# Patient Record
Sex: Female | Born: 1975 | Race: Asian | Hispanic: No | Marital: Married | Smoking: Never smoker
Health system: Southern US, Community
[De-identification: ages and names within clinical notes are randomized; demographics above are authoritative.]

## PROBLEM LIST (undated history)

## (undated) DIAGNOSIS — M1990 Unspecified osteoarthritis, unspecified site: Secondary | ICD-10-CM

## (undated) DIAGNOSIS — G43909 Migraine, unspecified, not intractable, without status migrainosus: Secondary | ICD-10-CM

## (undated) DIAGNOSIS — C50919 Malignant neoplasm of unspecified site of unspecified female breast: Secondary | ICD-10-CM

## (undated) HISTORY — PX: BILATERAL TOTAL MASTECTOMY WITH AXILLARY LYMPH NODE DISSECTION: SHX6364

## (undated) HISTORY — DX: Unspecified osteoarthritis, unspecified site: M19.90

## (undated) HISTORY — DX: Malignant neoplasm of unspecified site of unspecified female breast: C50.919

## (undated) HISTORY — DX: Migraine, unspecified, not intractable, without status migrainosus: G43.909

---

## 2018-03-24 DIAGNOSIS — Z170 Estrogen receptor positive status [ER+]: Principal | ICD-10-CM

## 2018-03-24 DIAGNOSIS — Z9013 Acquired absence of bilateral breasts and nipples: Secondary | ICD-10-CM | POA: Insufficient documentation

## 2018-03-24 DIAGNOSIS — C50811 Malignant neoplasm of overlapping sites of right female breast: Secondary | ICD-10-CM | POA: Insufficient documentation

## 2018-04-13 ENCOUNTER — Telehealth: Payer: Self-pay

## 2018-04-13 ENCOUNTER — Ambulatory Visit: Payer: Federal, State, Local not specified - PPO | Admitting: Adult Health

## 2018-04-13 ENCOUNTER — Encounter: Payer: Self-pay | Admitting: Adult Health

## 2018-04-13 VITALS — BP 108/72 | HR 84 | Resp 16 | Ht 63.0 in | Wt 135.0 lb

## 2018-04-13 DIAGNOSIS — Z170 Estrogen receptor positive status [ER+]: Secondary | ICD-10-CM

## 2018-04-13 DIAGNOSIS — G4700 Insomnia, unspecified: Secondary | ICD-10-CM | POA: Diagnosis not present

## 2018-04-13 DIAGNOSIS — N3000 Acute cystitis without hematuria: Secondary | ICD-10-CM

## 2018-04-13 DIAGNOSIS — G43809 Other migraine, not intractable, without status migrainosus: Secondary | ICD-10-CM | POA: Diagnosis not present

## 2018-04-13 DIAGNOSIS — C50811 Malignant neoplasm of overlapping sites of right female breast: Secondary | ICD-10-CM

## 2018-04-13 MED ORDER — AMITRIPTYLINE HCL 10 MG PO TABS: 10.0000 mg | ORAL_TABLET | Freq: Every day | ORAL | 3 refills | Status: DC

## 2018-04-13 NOTE — Telephone Encounter (Signed)
Faxed medical record request to [place]

## 2018-04-13 NOTE — Patient Instructions (Signed)

## 2018-04-13 NOTE — Progress Notes (Signed)
[facility] [place], [place] [address]  Internal MEDICINE  Office Visit Note  Patient Name: Deanna Dillon  [id number]  [id number]  Date of Service: 04/29/2018   Complaints/HPI Pt is here for establishment of PCP. Chief Complaint  Patient presents with  . Cancer    new pt hx breast cancer,   . Anxiety  . Quality Metric Gaps    pap smear   HPI Pt here to establish care.  She has a history of Breast CA with double mastectomy in September 2018.  She had radiation for 4 weeks and now is managed with PO medication.  She states she will take this medications for 10 years. She also reports history of Migraines, and insomnia.  She reports most of these issues have come about after starting Tamoxifen. Her other problem is having frequent UTI . 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 1/7 LNs were involved, with 2.0769mumor. Bios were ER/PR positive, her2 negative.  - Oncotype RS was 18 - did not get any adjuvant chemotherapy - completed adjuvant RT 06/2017 - started tamoxifen 07/2017  Current Medication: Outpatient Encounter Medications as of 04/13/2018  Medication Sig  . amitriptyline (ELAVIL) 10 MG tablet Take 1 tablet (10 mg total) by mouth at bedtime.  . cyclobenzaprine (FLEXERIL) 10 MG tablet Take 1 tablet (10 mg total) by mouth daily.  . tamoxifen (NOLVADEX) 20 MG tablet Take 20 mg by mouth daily.  . [DISCONTINUED] amitriptyline (ELAVIL) 10 MG tablet TAKE 1 TABLET BY MOUTH EVERY DAY IN THE EVENING AS NEEDED   No facility-administered encounter medications on file as of 04/13/2018.     Surgical History: Past Surgical History:  Procedure Laterality Date  . BILATERAL TOTAL MASTECTOMY WITH AXILLARY LYMPH NODE DISSECTION      Medical History: Past Medical History:  Diagnosis Date  .  [facility] ([facility]   Family History: Family History  Problem Relation Age of Onset  . Diabetes Mother   . Hypertension Mother   . Hyperlipidemia Mother     Social History   Socioeconomic History  . Marital status: Married    Spouse name: Not on file  . Number of children: Not on file  . Years of education: Not on file  . Highest education level: Not on file  Occupational History  . Not on file  Social Needs  . Financial resource strain: Not on file  . Food insecurity:    Worry: Not on file    Inability: Not on file  . Transportation needs:    Medical: Not on file    Non-medical: Not on file  Tobacco Use  . Smoking status: Never Smoker  . Smokeless tobacco: Never Used  Substance and Sexual Activity  . Alcohol use: Never    Frequency: Never  . Drug use: Never  . Sexual activity: Not on file  Lifestyle  . Physical activity:    Days per week: Not on file    Minutes per session: Not on file  . Stress: Not on file  Relationships  . Social connections:    Talks on phone: Not on file    Gets together: Not on file    Attends religious service: Not on file    Active member of club or organization: Not on file  Attends meetings of clubs or organizations: Not on file    Relationship status: Not on file  . Intimate partner violence:    Fear of current or ex partner: Not on file    Emotionally abused: Not on file    Physically abused: Not on file    Forced sexual activity: Not on file  Other Topics Concern  . Not on file  Social History Narrative  . Not on file    Review of Systems  Constitutional: Negative for chills, fatigue and unexpected weight change.  HENT: Negative for congestion, rhinorrhea, sneezing and sore throat.   Eyes: Negative for photophobia, pain and redness.  Respiratory: Negative for cough, chest tightness and shortness of breath.   Cardiovascular: Negative for chest pain and palpitations.  Gastrointestinal: Negative for abdominal pain,  constipation, diarrhea, nausea and vomiting.  Endocrine: Negative.   Genitourinary: Negative for dysuria and frequency.  Musculoskeletal: Negative for arthralgias, back pain, joint swelling and neck pain.  Skin: Negative for rash.  Allergic/Immunologic: Negative.   Neurological: Negative for tremors and numbness.  Hematological: Negative for adenopathy. Does not bruise/bleed easily.  Psychiatric/Behavioral: Negative for behavioral problems and sleep disturbance. The patient is not nervous/anxious.     Vital Signs: BP 108/72   Pulse 84   Resp 16   Ht '5\' 3"'$  (1.6 m)   Wt 135 lb (61.2 kg)   SpO2 98%   BMI 23.91 kg/m    Physical Exam  Constitutional: She is oriented to person, place, and time. She appears well-developed and well-nourished. No distress.  HENT:  Head: Normocephalic and atraumatic.  Mouth/Throat: Oropharynx is clear and moist. No oropharyngeal exudate.  Eyes: Pupils are equal, round, and reactive to light. EOM are normal.  Neck: Normal range of motion. Neck supple. No JVD present. No tracheal deviation present. No thyromegaly present.  Cardiovascular: Normal rate, regular rhythm and normal heart sounds. Exam reveals no gallop and no friction rub.  No murmur heard. Pulmonary/Chest: Effort normal and breath sounds normal. No respiratory distress. She has no wheezes. She has no rales. She exhibits no tenderness.  Abdominal: Soft. There is no tenderness. There is no guarding.  Musculoskeletal: Normal range of motion.  Lymphadenopathy:    She has no cervical adenopathy.  Neurological: She is alert and oriented to person, place, and time. No cranial nerve deficit.  Skin: Skin is warm and dry. She is not diaphoretic.  Psychiatric: She has a normal mood and affect. Her behavior is normal. Judgment and thought content normal.  Nursing note and vitals reviewed.  Assessment/Plan: 1. Malignant neoplasm of overlapping sites of right breast in female, estrogen receptor positive  ([place]) Pt has establish care at [facility] to continue her follow ups.    2. Other migraine without status migrainosus, not intractable Pt takes elavil at night for migraines.  Has good results. Might add low dose topamax 25 mg po qhs if needed   3. Insomnia, unspecified type Pt takes Elavil at night to help with insomnia.  She has good results with this. Continues as directed.   4. Acute cystitis without hematuria Might be related to estrogen deficiency leading to atrophic vaginitis  - Ambulatory referral to Urology  General Counseling: Ramatoulaye verbalizes understanding of the findings of todays visit and agrees with plan of treatment. I have discussed any further diagnostic evaluation that may be needed or ordered today. We also reviewed her medications today. she has been encouraged to call the office with any questions or concerns that  should arise related to todays visit.  Orders Placed This Encounter  Procedures  . Ambulatory referral to Urology    Meds ordered this encounter  Medications  . amitriptyline (ELAVIL) 10 MG tablet    Sig: Take 1 tablet (10 mg total) by mouth at bedtime.    Dispense:  30 tablet    Refill:  3  . cyclobenzaprine (FLEXERIL) 10 MG tablet    Sig: Take 1 tablet (10 mg total) by mouth daily.    Dispense:  30 tablet    Refill:  0   Time spent: 25 Minutes   This patient was seen by Orson Gear AGNP-C in Collaboration with Dr Lavera Guise as a part of collaborative care agreement

## 2018-04-18 ENCOUNTER — Telehealth: Payer: Self-pay

## 2018-04-18 MED ORDER — CYCLOBENZAPRINE HCL 10 MG PO TABS: 10.0000 mg | ORAL_TABLET | Freq: Every day | ORAL | 0 refills | Status: DC

## 2018-04-18 NOTE — Telephone Encounter (Signed)
Left message on pt voicemail letting her know we sent her rx for flexaril and also adam sent a referral for her to see a urologist to [facility] and she will give her a call as soon as she get the appointment. (pt left message requesting a referral to urologist due to reoccurring  UTI's.

## 2018-04-20 ENCOUNTER — Encounter: Payer: Self-pay | Admitting: Adult Health

## 2018-04-20 ENCOUNTER — Ambulatory Visit: Payer: Federal, State, Local not specified - PPO | Admitting: Adult Health

## 2018-04-20 VITALS — BP 100/70 | HR 95 | Temp 99.1°F | Resp 16 | Ht 63.0 in | Wt 139.0 lb

## 2018-04-20 DIAGNOSIS — G43809 Other migraine, not intractable, without status migrainosus: Secondary | ICD-10-CM | POA: Diagnosis not present

## 2018-04-20 DIAGNOSIS — S161XXA Strain of muscle, fascia and tendon at neck level, initial encounter: Secondary | ICD-10-CM | POA: Diagnosis not present

## 2018-04-20 MED ORDER — IBUPROFEN 600 MG PO TABS: 600.0000 mg | ORAL_TABLET | Freq: Three times a day (TID) | ORAL | 0 refills | Status: DC | PRN

## 2018-04-20 NOTE — Progress Notes (Signed)
[facility] [place], [place] [address]  Internal MEDICINE  Office Visit Note  Patient Name: Deanna Dillon  [id number]  [id number]  Date of Service: 05/01/2018  Chief Complaint  Patient presents with  . Migraine  . Neck Pain   HPI Pt is here for a sick visit.  Pt reports about one week ago she started having neck pain and tightness.  This pain has moved down into her shoulders.  She reports extra stress and tension with work in the last few weeks.  She started taking her flexeril with some relief. She denies trauma or injury.  No fever, Nausea or vomiting.  The pain is worse with palpation.   Current Medication:  Outpatient Encounter Medications as of 04/20/2018  Medication Sig  . amitriptyline (ELAVIL) 10 MG tablet Take 1 tablet (10 mg total) by mouth at bedtime.  . cyclobenzaprine (FLEXERIL) 10 MG tablet Take 1 tablet (10 mg total) by mouth daily.  . tamoxifen (NOLVADEX) 20 MG tablet Take 20 mg by mouth daily.  Marland Kitchen ibuprofen (ADVIL,MOTRIN) 600 MG tablet Take 1 tablet (600 mg total) by mouth every 8 (eight) hours as needed.   No facility-administered encounter medications on file as of 04/20/2018.    Medical History: Past Medical History:  Diagnosis Date  . Breast cancer ([facility]CC)    Vital Signs: BP 100/70   Pulse 95   Temp 99.1 F (37.3 C)   Resp 16   Ht 5\' 3"  (1.6 m)   Wt 139 lb (63 kg)   SpO2 98%   BMI 24.62 kg/m   Review of Systems  Constitutional: Negative for chills, fatigue and unexpected weight change.  HENT: Negative for congestion, rhinorrhea, sneezing and sore throat.   Eyes: Negative for photophobia, pain and redness.  Respiratory: Negative for cough, chest tightness and shortness of breath.   Cardiovascular: Negative for chest pain and palpitations.  Gastrointestinal: Negative for abdominal pain, constipation, diarrhea, nausea and vomiting.  Endocrine: Negative.   Genitourinary: Negative for dysuria and frequency.  Musculoskeletal:  Negative for arthralgias, back pain, joint swelling and neck pain.  Skin: Negative for rash.  Allergic/Immunologic: Negative.   Neurological: Negative for tremors and numbness.  Hematological: Negative for adenopathy. Does not bruise/bleed easily.  Psychiatric/Behavioral: Negative for behavioral problems and sleep disturbance. The patient is not nervous/anxious.    Physical Exam  Constitutional: She is oriented to person, place, and time. She appears well-developed and well-nourished. No distress.  HENT:  Head: Normocephalic and atraumatic.  Mouth/Throat: Oropharynx is clear and moist. No oropharyngeal exudate.  Eyes: Pupils are equal, round, and reactive to light. EOM are normal.  Neck: Normal range of motion. Neck supple. No JVD present. No tracheal deviation present. No thyromegaly present.  Cardiovascular: Normal rate, regular rhythm and normal heart sounds. Exam reveals no gallop and no friction rub.  No murmur heard. Pulmonary/Chest: Effort normal and breath sounds normal. No respiratory distress. She has no wheezes. She has no rales. She exhibits no tenderness.  Abdominal: Soft. There is no tenderness. There is no guarding.  Musculoskeletal: Normal range of motion. She exhibits tenderness.  Neck pain, stiffness    Lymphadenopathy:    She has no cervical adenopathy.  Neurological: She is alert and oriented to person, place, and time. No cranial nerve deficit.  Skin: Skin is warm and dry. She is not diaphoretic.  Psychiatric: She has a normal mood and affect. Her behavior is normal. Judgment and thought content normal.  Nursing note and vitals  reviewed.  Assessment/Plan: 1. Strain of neck muscle, initial encounter Take Motrin as directed. Use Heating pad, 35minutes on 2 hours off.  Rest for a few days and return to clinic if symptoms do not resolve.   - ibuprofen (ADVIL,MOTRIN) 600 MG tablet; Take 1 tablet (600 mg total) by mouth every 8 (eight) hours as needed.  Dispense: 30  tablet; Refill: 0  2. Other migraine without status migrainosus, not intractable Pt has intermittent migraines.  She is doing well currently with medications she already has.  Will consider Emgality in future.   General Counseling: Deanna Dillon verbalizes understanding of the findings of todays visit and agrees with plan of treatment. I have discussed any further diagnostic evaluation that may be needed or ordered today. We also reviewed her medications today. she has been encouraged to call the office with any questions or concerns that should arise related to todays visit.   Meds ordered this encounter  Medications  . ibuprofen (ADVIL,MOTRIN) 600 MG tablet    Sig: Take 1 tablet (600 mg total) by mouth every 8 (eight) hours as needed.    Dispense:  30 tablet    Refill:  0    Time spent: 25 Minutes  This patient was seen by Orson Gear AGNP-C in Collaboration with Dr Lavera Guise as a part of collaborative care agreement

## 2018-05-09 ENCOUNTER — Other Ambulatory Visit: Payer: Self-pay | Admitting: Adult Health

## 2018-05-09 DIAGNOSIS — S161XXA Strain of muscle, fascia and tendon at neck level, initial encounter: Secondary | ICD-10-CM

## 2018-05-09 MED ORDER — CYCLOBENZAPRINE HCL 10 MG PO TABS: 10.0000 mg | ORAL_TABLET | Freq: Every day | ORAL | 2 refills | Status: DC

## 2018-05-17 ENCOUNTER — Other Ambulatory Visit: Payer: Self-pay

## 2018-05-17 DIAGNOSIS — Z170 Estrogen receptor positive status [ER+]: Principal | ICD-10-CM

## 2018-05-17 DIAGNOSIS — C50811 Malignant neoplasm of overlapping sites of right female breast: Secondary | ICD-10-CM

## 2018-05-18 ENCOUNTER — Ambulatory Visit: Payer: Federal, State, Local not specified - PPO | Admitting: Urology

## 2018-05-18 ENCOUNTER — Encounter: Payer: Self-pay | Admitting: Urology

## 2018-05-18 VITALS — BP 118/82 | HR 101 | Ht 63.0 in | Wt 139.4 lb

## 2018-05-18 DIAGNOSIS — N390 Urinary tract infection, site not specified: Secondary | ICD-10-CM | POA: Diagnosis not present

## 2018-05-18 LAB — URINALYSIS, COMPLETE
Bilirubin, UA: NEGATIVE
Glucose, UA: NEGATIVE
Ketones, UA: NEGATIVE
LEUKOCYTES UA: NEGATIVE
Nitrite, UA: NEGATIVE
PH UA: 6 (ref 5.0–7.5)
PROTEIN UA: NEGATIVE
Specific Gravity, UA: 1.015 (ref 1.005–1.030)
Urobilinogen, Ur: 0.2 mg/dL (ref 0.2–1.0)

## 2018-05-18 LAB — MICROSCOPIC EXAMINATION

## 2018-05-18 NOTE — Progress Notes (Signed)
05/18/2018 10:12 AM   Deanna Dillon 1976-06-30 [id number]  Referring provider: Kendell Bane, NP [place], [place] [address]  Chief Complaint  Patient presents with  . Recurrent UTI    HPI: 42 year old female seen in consultation at the request of Orson Gear, NP for evaluation of recurrent UTI.  She relates to a long history of recurrent lower tract UTIs.  Although her current records have no documentation she states when living in [place] she typically had UTIs 4-6 times per month.  Her symptoms typically consist of onset of urinary frequency, urgency, dysuria and a tingling sensation in her hands and feet.  There is no history of febrile UTI or pyelonephritis.  No prior urologic evaluation.  She is sexually active however sees no relation of symptom onset within 24 hours of intercourse.  She was calling the [facility] telemedicine line again due to the frequency of the skull states she was advised to see a urologist.  Her symptoms resolved with antibiotic therapy then recur.  She denies gross hematuria.  She denies flank, abdominal or pelvic pain.  She has breast cancer and underwent bilateral mastectomy with reconstruction.  She is currently on tamoxifen.   PMH: Past Medical History:  Diagnosis Date  . Arthritis   . Breast cancer [facility])     Surgical History: Past Surgical History:  Procedure Laterality Date  . BILATERAL TOTAL MASTECTOMY WITH AXILLARY LYMPH NODE DISSECTION      Home Medications:  Allergies as of 05/18/2018      Reactions   Oxycodone       Medication List        Accurate as of 05/18/18 10:12 AM. Always use your most recent med list.          amitriptyline 10 MG tablet Commonly known as:  ELAVIL Take 1 tablet (10 mg total) by mouth at bedtime.   cyclobenzaprine 10 MG tablet Commonly known as:  FLEXERIL Take 1 tablet (10 mg total) by mouth daily.   ibuprofen 600 MG tablet Commonly known as:  ADVIL,MOTRIN Take 1 tablet (600 mg  total) by mouth every 8 (eight) hours as needed.   tamoxifen 20 MG tablet Commonly known as:  NOLVADEX Take 20 mg by mouth daily.       Allergies:  Allergies  Allergen Reactions  . Oxycodone     Family History: Family History  Problem Relation Age of Onset  . Diabetes Mother   . Hypertension Mother   . Hyperlipidemia Mother     Social History:  reports that she has never smoked. She has never used smokeless tobacco. She reports that she does not drink alcohol or use drugs.  ROS: UROLOGY Frequent Urination?: Yes Hard to postpone urination?: Yes Burning/pain with urination?: Yes Get up at night to urinate?: Yes Leakage of urine?: No Urine stream starts and stops?: No Trouble starting stream?: No Do you have to strain to urinate?: No Blood in urine?: Yes Urinary tract infection?: No Sexually transmitted disease?: No Injury to kidneys or bladder?: No Painful intercourse?: No Weak stream?: No Currently pregnant?: No Vaginal bleeding?: No Last menstrual period?: Unknown  Gastrointestinal Nausea?: No Vomiting?: No Indigestion/heartburn?: No Diarrhea?: No Constipation?: Yes  Constitutional Fever: No Night sweats?: No Weight loss?: No Fatigue?: Yes  Skin Skin rash/lesions?: No Itching?: No  Eyes Blurred vision?: No Double vision?: No  Ears/Nose/Throat Sore throat?: No Sinus problems?: No  Hematologic/Lymphatic Swollen glands?: No Easy bruising?: No  Cardiovascular Leg swelling?: No Chest pain?:  No  Respiratory Cough?: No Shortness of breath?: No  Endocrine Excessive thirst?: No  Musculoskeletal Back pain?: No Joint pain?: No  Neurological Headaches?: Yes Dizziness?: Yes  Psychologic Depression?: No Anxiety?: No  Physical Exam: BP 118/82 (BP Location: Left Arm, Patient Position: Sitting, Cuff Size: Normal)   Pulse (!) 101   Ht 5\' 3"  (1.6 m)   Wt 139 lb 6.4 oz (63.2 kg)   BMI 24.69 kg/m   Constitutional:  Alert and oriented,  No acute distress. HEENT: [place] AT, moist mucus membranes.  Trachea midline, no masses. Cardiovascular: No clubbing, cyanosis, or edema. Respiratory: Normal respiratory effort, no increased work of breathing. GI: Abdomen is soft, nontender, nondistended, no abdominal masses GU: No CVA tenderness Lymph: No cervical or inguinal lymphadenopathy. Skin: No rashes, bruises or suspicious lesions. Neurologic: Grossly intact, no focal deficits, moving all 4 extremities. Psychiatric: Normal mood and affect.  Laboratory Data:  Urinalysis Dipstick trace blood Microscopy no RBC/WBC   Assessment & Plan:   42 year old female with history of recurrent lower tract UTIs.  Urinalysis today was clear.  Will schedule renal ultrasound and cystoscopy.  She was instructed to call for symptom onset and would like to check urinalysis and culture when having symptoms.   Abbie Sons, [place] [address] [place], [place] [address] [phone]

## 2018-06-22 ENCOUNTER — Other Ambulatory Visit: Payer: Federal, State, Local not specified - PPO | Admitting: Urology

## 2018-06-25 ENCOUNTER — Ambulatory Visit: Payer: Federal, State, Local not specified - PPO

## 2018-06-29 ENCOUNTER — Other Ambulatory Visit: Payer: Self-pay | Admitting: Urology

## 2018-07-06 ENCOUNTER — Other Ambulatory Visit: Payer: Self-pay | Admitting: Nurse Practitioner

## 2018-07-11 ENCOUNTER — Telehealth: Payer: Self-pay | Admitting: Urology

## 2018-07-11 ENCOUNTER — Other Ambulatory Visit: Payer: Federal, State, Local not specified - PPO

## 2018-07-11 DIAGNOSIS — N390 Urinary tract infection, site not specified: Secondary | ICD-10-CM

## 2018-07-11 LAB — URINALYSIS, COMPLETE
BILIRUBIN UA: NEGATIVE
KETONES UA: NEGATIVE
NITRITE UA: POSITIVE — AB
UUROB: 1 mg/dL (ref 0.2–1.0)
pH, UA: 5 (ref 5.0–7.5)

## 2018-07-11 LAB — MICROSCOPIC EXAMINATION: WBC, UA: >30 /hpf — ABNORMAL HIGH (ref 0–5)

## 2018-07-11 MED ORDER — SULFAMETHOXAZOLE-TRIMETHOPRIM 800-160 MG PO TABS: 1.0000 | ORAL_TABLET | Freq: Two times a day (BID) | ORAL | 0 refills | Status: AC

## 2018-07-11 NOTE — Telephone Encounter (Signed)
Pt came by office with UTI symptoms, including, burning, urgency and frequency.  She's not going a lot.  I gave her UTI checklist and urine cup.

## 2018-07-11 NOTE — Telephone Encounter (Signed)
Pt presented in clinic with UTI sx. Pt c/o urinary frequency, burning and painful urination, nausea, as well as hard to postpone urine. Pt has hx of recurrent uti's, reviewed pt's urinalysis with Dr.Stoioff who recommends starting the patient on Bactrim DS. RX sent in, patient informed. Urine sent for culture.

## 2018-07-14 LAB — CULTURE, URINE COMPREHENSIVE

## 2018-07-16 ENCOUNTER — Other Ambulatory Visit: Payer: Self-pay | Admitting: Adult Health

## 2018-07-16 DIAGNOSIS — C50811 Malignant neoplasm of overlapping sites of right female breast: Secondary | ICD-10-CM

## 2018-07-16 DIAGNOSIS — Z170 Estrogen receptor positive status [ER+]: Principal | ICD-10-CM

## 2018-07-16 MED ORDER — AMITRIPTYLINE HCL 10 MG PO TABS: 10.0000 mg | ORAL_TABLET | Freq: Every day | ORAL | 1 refills | Status: DC

## 2018-07-20 ENCOUNTER — Ambulatory Visit (INDEPENDENT_AMBULATORY_CARE_PROVIDER_SITE_OTHER): Payer: Federal, State, Local not specified - PPO | Admitting: Nurse Practitioner

## 2018-07-20 ENCOUNTER — Encounter: Payer: Self-pay | Admitting: Nurse Practitioner

## 2018-07-20 ENCOUNTER — Telehealth: Payer: Self-pay | Admitting: Nurse Practitioner

## 2018-07-20 VITALS — BP 124/93 | HR 108 | Resp 16 | Ht 63.0 in | Wt 137.0 lb

## 2018-07-20 DIAGNOSIS — K581 Irritable bowel syndrome with constipation: Secondary | ICD-10-CM | POA: Insufficient documentation

## 2018-07-20 DIAGNOSIS — Z170 Estrogen receptor positive status [ER+]: Secondary | ICD-10-CM

## 2018-07-20 DIAGNOSIS — Z0001 Encounter for general adult medical examination with abnormal findings: Secondary | ICD-10-CM

## 2018-07-20 DIAGNOSIS — R300 Dysuria: Secondary | ICD-10-CM | POA: Insufficient documentation

## 2018-07-20 DIAGNOSIS — S161XXA Strain of muscle, fascia and tendon at neck level, initial encounter: Secondary | ICD-10-CM | POA: Insufficient documentation

## 2018-07-20 DIAGNOSIS — C50811 Malignant neoplasm of overlapping sites of right female breast: Secondary | ICD-10-CM | POA: Diagnosis not present

## 2018-07-20 DIAGNOSIS — Z124 Encounter for screening for malignant neoplasm of cervix: Secondary | ICD-10-CM | POA: Diagnosis not present

## 2018-07-20 MED ORDER — LUBIPROSTONE 24 MCG PO CAPS: 24.0000 ug | ORAL_CAPSULE | Freq: Two times a day (BID) | ORAL | 1 refills | Status: DC

## 2018-07-20 MED ORDER — CYCLOBENZAPRINE HCL 10 MG PO TABS: 10.0000 mg | ORAL_TABLET | Freq: Every day | ORAL | 1 refills | Status: DC

## 2018-07-20 MED ORDER — AMITRIPTYLINE HCL 10 MG PO TABS: 10.0000 mg | ORAL_TABLET | Freq: Every day | ORAL | 1 refills | Status: DC

## 2018-07-20 MED ORDER — IBUPROFEN 600 MG PO TABS: 600.0000 mg | ORAL_TABLET | Freq: Three times a day (TID) | ORAL | 1 refills | Status: DC | PRN

## 2018-07-20 NOTE — Progress Notes (Signed)
[facility] [place], [place] [address]  Internal MEDICINE  Office Visit Note  Patient Name: Deanna Dillon  [id number]  [id number]  Date of Service: 07/20/2018  Chief Complaint  Patient presents with  . Gynecologic Exam  . Annual Exam  . Constipation    pt is more constipated now than usual, has tried metamucil and is not helping.     The patient is here for routine health maintenance exam. Today, her largest concern is constipation. She states that this has been an ongoing problem for her for at least 20 years. She has tried OTC miralax, stool softeners, and laxatives. They have not helped at all. Generally, when she gets very constipated, she will have to use a fleets suppository. She states that over the last 2 weeks, the suppositories have not even helped.  She has history of breast cancer. Had bilateral mastectomy with reconstructive surgery. Just finished her first year on tamoxifen. Doing well.  She is due to have routine fasting labs done. She will get flu shot at her local pharmacy.   Pt is here for routine health maintenance examination  Current Medication: Outpatient Encounter Medications as of 07/20/2018  Medication Sig  . amitriptyline (ELAVIL) 10 MG tablet Take 1 tablet (10 mg total) by mouth at bedtime.  . cyclobenzaprine (FLEXERIL) 10 MG tablet Take 1 tablet (10 mg total) by mouth daily.  Marland Kitchen ibuprofen (ADVIL,MOTRIN) 600 MG tablet Take 1 tablet (600 mg total) by mouth every 8 (eight) hours as needed.  . tamoxifen (NOLVADEX) 20 MG tablet Take 20 mg by mouth daily.  . [DISCONTINUED] amitriptyline (ELAVIL) 10 MG tablet Take 1 tablet (10 mg total) by mouth at bedtime.  . [DISCONTINUED] cyclobenzaprine (FLEXERIL) 10 MG tablet Take 1 tablet (10 mg total) by mouth daily.  . [DISCONTINUED] ibuprofen (ADVIL,MOTRIN) 600 MG tablet Take 1 tablet (600 mg total) by mouth every 8 (eight) hours as needed.  . lubiprostone (AMITIZA) 24 MCG capsule Take 1 capsule  (24 mcg total) by mouth 2 (two) times daily with a meal.   No facility-administered encounter medications on file as of 07/20/2018.     Surgical History: Past Surgical History:  Procedure Laterality Date  . BILATERAL TOTAL MASTECTOMY WITH AXILLARY LYMPH NODE DISSECTION      Medical History: Past Medical History:  Diagnosis Date  . Arthritis   . Breast cancer ([place])     Family History: Family History  Problem Relation Age of Onset  . Diabetes Mother   . Hypertension Mother   . Hyperlipidemia Mother       Review of Systems  Constitutional: Negative for chills, fatigue and unexpected weight change.  HENT: Negative for congestion, postnasal drip, rhinorrhea, sneezing and sore throat.   Respiratory: Negative for cough, chest tightness and shortness of breath.   Cardiovascular: Negative for chest pain and palpitations.  Gastrointestinal: Positive for abdominal pain and constipation. Negative for diarrhea, nausea and vomiting.  Endocrine: Negative for cold intolerance, heat intolerance, polydipsia and polyuria.  Genitourinary: Negative for dysuria, frequency and urgency.  Musculoskeletal: Negative for arthralgias, back pain, joint swelling and neck pain.  Skin: Negative for rash.  Allergic/Immunologic: Positive for environmental allergies.  Neurological: Negative for dizziness, tremors, numbness and headaches.  Hematological: Negative for adenopathy. Does not bruise/bleed easily.  Psychiatric/Behavioral: Negative for behavioral problems (Depression), sleep disturbance and suicidal ideas. The patient is not nervous/anxious.     Today's Vitals   07/20/18 1039  BP: (!) 124/93  Pulse: (!) 108  Resp: 16  SpO2: 99%  Weight: 137 lb (62.1 kg)  Height: 5\' 3"  (1.6 m)    Physical Exam  Constitutional: She is oriented to person, place, and time. She appears well-developed and well-nourished. No distress.  HENT:  Head: Normocephalic and atraumatic.  Nose: Nose normal.   Mouth/Throat: Oropharynx is clear and moist. No oropharyngeal exudate.  Eyes: Pupils are equal, round, and reactive to light. Conjunctivae and EOM are normal.  Neck: Normal range of motion. Neck supple. No JVD present. No tracheal deviation present. No thyromegaly present.  Cardiovascular: Normal rate, regular rhythm, normal heart sounds and intact distal pulses. Exam reveals no gallop and no friction rub.  No murmur heard. Pulmonary/Chest: Effort normal and breath sounds normal. No respiratory distress. She has no wheezes. She has no rales. She exhibits no tenderness.  Breast exam deferred. Followed by oncology/surgery  Abdominal: Soft. Bowel sounds are normal. There is no tenderness.  Genitourinary: Vagina normal and uterus normal.  Genitourinary Comments: No tenderness, masses, or organomeglay present during bimanual exam .  Musculoskeletal: Normal range of motion.  Lymphadenopathy:    She has no cervical adenopathy.  Neurological: She is alert and oriented to person, place, and time. No cranial nerve deficit.  Skin: Skin is warm and dry. Capillary refill takes less than 2 seconds. She is not diaphoretic.  Psychiatric: She has a normal mood and affect. Her behavior is normal. Judgment and thought content normal.  Nursing note and vitals reviewed.    LABS: Recent Results (from the past 2160 hour(s))  Urinalysis, Complete     Status: Abnormal   Collection Time: 05/18/18  9:30 AM  Result Value Ref Range   Specific Gravity, UA 1.015 1.005 - 1.030   pH, UA 6.0 5.0 - 7.5   Color, UA Yellow Yellow   Appearance Ur Clear Clear   Leukocytes, UA Negative Negative   Protein, UA Negative Negative/Trace   Glucose, UA Negative Negative   Ketones, UA Negative Negative   RBC, UA Trace (A) Negative   Bilirubin, UA Negative Negative   Urobilinogen, Ur 0.2 0.2 - 1.0 mg/dL   Nitrite, UA Negative Negative   Microscopic Examination See below:   Microscopic Examination     Status: Abnormal    Collection Time: 05/18/18  9:30 AM  Result Value Ref Range   WBC, UA 0-5 0 - 5 /hpf   RBC, UA 0-2 0 - 2 /hpf   Epithelial Cells (non renal) 0-10 0 - 10 /hpf   Mucus, UA Present (A) Not Estab.   Bacteria, UA Moderate (A) None seen/Few  Urinalysis, Complete     Status: Abnormal   Collection Time: 07/11/18 11:02 AM  Result Value Ref Range   Specific Gravity, UA <1.005 (L) 1.005 - 1.030   pH, UA 5.0 5.0 - 7.5   Color, UA Orange Yellow   Appearance Ur Cloudy (A) Clear   Leukocytes, UA 3+ (A) Negative   Protein, UA 1+ (A) Negative/Trace   Glucose, UA 1+ (A) Negative   Ketones, UA Negative Negative   RBC, UA 1+ (A) Negative   Bilirubin, UA Negative Negative   Urobilinogen, Ur 1.0 0.2 - 1.0 mg/dL   Nitrite, UA Positive (A) Negative   Microscopic Examination See below:   Microscopic Examination     Status: Abnormal   Collection Time: 07/11/18 11:02 AM  Result Value Ref Range   WBC, UA >30 (H) 0 - 5 /hpf   RBC, UA 3-10 (A) 0 - 2 /hpf  Epithelial Cells (non renal) 0-10 0 - 10 /hpf   Bacteria, UA Present (A) None seen/Few   Yeast, UA Many (A) None seen  CULTURE, URINE COMPREHENSIVE     Status: Abnormal   Collection Time: 07/11/18 11:06 AM  Result Value Ref Range   Urine Culture, Comprehensive Final report (A)    Organism ID, Bacteria Escherichia coli (A)     Comment: Greater than 100,000 colony forming units per mL Cefazolin <=4 ug/mL Cefazolin with an MIC <=16 predicts susceptibility to the oral agents cefaclor, cefdinir, cefpodoxime, cefprozil, cefuroxime, cephalexin, and loracarbef when used for therapy of uncomplicated urinary tract infections due to E. coli, Klebsiella pneumoniae, and Proteus mirabilis.    ANTIMICROBIAL SUSCEPTIBILITY Comment     Comment:       ** S = Susceptible; I = Intermediate; R = Resistant **                    P = Positive; N = Negative             MICS are expressed in micrograms per mL    Antibiotic                 RSLT#1    RSLT#2    RSLT#3     RSLT#4 Amoxicillin/Clavulanic Acid    S Ampicillin                     S Cefepime                       S Ceftriaxone                    S Cefuroxime                     S Ciprofloxacin                  S Ertapenem                      S Gentamicin                     S Imipenem                       S Levofloxacin                   S Meropenem                      S Nitrofurantoin                 S Piperacillin/Tazobactam        S Tetracycline                   S Tobramycin                     S Trimethoprim/Sulfa             S     Assessment/Plan: 1. Encounter for general adult medical examination with abnormal findings Annual health maintenance exam with pap smear today  2. Irritable bowel syndrome with constipation Has tried numerous OTC remedies, including suppositories. No improvement in symptoms. Will start Amitiza 30mcg twice daily. Recommended adequate water and fiber intake.  - lubiprostone (AMITIZA) 24 MCG capsule; Take 1 capsule (24 mcg total) by mouth  2 (two) times daily with a meal.  Dispense: 180 capsule; Refill: 1  3. Malignant neoplasm of overlapping sites of right breast in female, estrogen receptor positive ([place]) Continue with routine follow ups per surgery/oncology.  4. Strain of neck muscle, initial encounter May continue ibuprofen and flexeril as needed and as prescribed. Refills provided today.  - cyclobenzaprine (FLEXERIL) 10 MG tablet; Take 1 tablet (10 mg total) by mouth daily.  Dispense: 90 tablet; Refill: 1 - ibuprofen (ADVIL,MOTRIN) 600 MG tablet; Take 1 tablet (600 mg total) by mouth every 8 (eight) hours as needed.  Dispense: 90 tablet; Refill: 1  5. Routine cervical smear - Pap IG and HPV (high risk) DNA detection  6. Dysuria - UA/M w/rflx Culture, Routine  General Counseling: Deanna Dillon verbalizes understanding of the findings of todays visit and agrees with plan of treatment. I have discussed any further diagnostic evaluation that may be needed  or ordered today. We also reviewed her medications today. she has been encouraged to call the office with any questions or concerns that should arise related to todays visit.    Counseling:  A high fiber diet with plenty of fluids (up to 8 glasses of water daily) is suggested to relieve these symptoms.  Metamucil, 1 tablespoon once or twice daily can be used to keep bowels regular if needed.  This patient was seen by Leretha Pol FNP Collaboration with Dr Lavera Guise as a part of collaborative care agreement  Orders Placed This Encounter  Procedures  . UA/M w/rflx Culture, Routine    Meds ordered this encounter  Medications  . lubiprostone (AMITIZA) 24 MCG capsule    Sig: Take 1 capsule (24 mcg total) by mouth 2 (two) times daily with a meal.    Dispense:  180 capsule    Refill:  1    Order Specific Question:   Supervising Provider    Answer:   Lavera Guise [place]  . amitriptyline (ELAVIL) 10 MG tablet    Sig: Take 1 tablet (10 mg total) by mouth at bedtime.    Dispense:  90 tablet    Refill:  1    Order Specific Question:   Supervising Provider    Answer:   Lavera Guise [[id number]]  . cyclobenzaprine (FLEXERIL) 10 MG tablet    Sig: Take 1 tablet (10 mg total) by mouth daily.    Dispense:  90 tablet    Refill:  1    Please fills as 90 day prescription    Order Specific Question:   Supervising Provider    Answer:   Lavera Guise [place]  . ibuprofen (ADVIL,MOTRIN) 600 MG tablet    Sig: Take 1 tablet (600 mg total) by mouth every 8 (eight) hours as needed.    Dispense:  90 tablet    Refill:  1    Order Specific Question:   Supervising Provider    Answer:   Lavera Guise [[id number]]    Time spent: [place], MD  Internal Medicine

## 2018-07-20 NOTE — Telephone Encounter (Signed)
Prior authorizarion for medication Amitiza 24 mcg Has been approved from 06/20/2018-07/20/2019  Pharmacy notified

## 2018-07-23 ENCOUNTER — Telehealth: Payer: Self-pay | Admitting: Nurse Practitioner

## 2018-07-23 ENCOUNTER — Other Ambulatory Visit: Payer: Self-pay | Admitting: Nurse Practitioner

## 2018-07-23 MED ORDER — LINACLOTIDE 145 MCG PO CAPS: 145.0000 ug | ORAL_CAPSULE | Freq: Every day | ORAL | 1 refills | Status: DC

## 2018-07-23 NOTE — Telephone Encounter (Signed)
Prior auth for linzess has been approved   Pharmacy notified 06/23/2018 through 07/23/2019.

## 2018-07-23 NOTE — Telephone Encounter (Signed)
Change in therapy due to cost of medication

## 2018-07-24 LAB — PAP IG AND HPV HIGH-RISK: HPV, high-risk: NEGATIVE

## 2018-07-25 ENCOUNTER — Other Ambulatory Visit: Payer: Self-pay | Admitting: Nurse Practitioner

## 2018-07-25 ENCOUNTER — Telehealth: Payer: Self-pay

## 2018-07-25 DIAGNOSIS — N390 Urinary tract infection, site not specified: Secondary | ICD-10-CM

## 2018-07-25 MED ORDER — SULFAMETHOXAZOLE-TRIMETHOPRIM 800-160 MG PO TABS: 1.0000 | ORAL_TABLET | Freq: Two times a day (BID) | ORAL | 0 refills | Status: DC

## 2018-07-25 NOTE — Progress Notes (Signed)
Patient's urine sample at time of physical did show presence of infection. I have added bactrim Ds twice daily for 7 days. Sent to [facility] pharmacy

## 2018-07-25 NOTE — Telephone Encounter (Signed)
We sent in new prescription for linzess on Monday to see if that would be less expensive. Can you see if she knows about that and if it is less expensive? thanks

## 2018-07-25 NOTE — Telephone Encounter (Signed)
Informed pt of pap results 

## 2018-07-25 NOTE — Telephone Encounter (Signed)
Spoke with pt informed her linzess was sent on the place of the amitiza and also she has a rx for bactrim for the infection that showed in her urine. Informed pt that if the rx for linzess was too expensive to give [place] a call back.

## 2018-07-25 NOTE — Telephone Encounter (Signed)
-----   Message from Ronnell Freshwater, NP sent at 07/24/2018 12:24 PM EST ----- Please let the patient know that her pap smear is normal. Thanks.

## 2018-07-26 ENCOUNTER — Other Ambulatory Visit: Payer: Self-pay | Admitting: Nurse Practitioner

## 2018-07-26 DIAGNOSIS — N390 Urinary tract infection, site not specified: Secondary | ICD-10-CM

## 2018-07-26 LAB — UA/M W/RFLX CULTURE, ROUTINE
Bilirubin, UA: NEGATIVE
Glucose, UA: NEGATIVE
NITRITE UA: NEGATIVE
PH UA: 5 (ref 5.0–7.5)
Protein, UA: NEGATIVE
Specific Gravity, UA: 1.015 (ref 1.005–1.030)
UUROB: 0.2 mg/dL (ref 0.2–1.0)

## 2018-07-26 LAB — MICROSCOPIC EXAMINATION
CASTS: NONE SEEN /LPF
Epithelial Cells (non renal): >10 /hpf — AB (ref 0–10)
WBC, UA: >30 /hpf — AB (ref 0–5)

## 2018-07-26 LAB — URINE CULTURE, REFLEX

## 2018-07-26 MED ORDER — NITROFURANTOIN MONOHYD MACRO 100 MG PO CAPS: 100.0000 mg | ORAL_CAPSULE | Freq: Two times a day (BID) | ORAL | 0 refills | Status: DC

## 2018-07-26 NOTE — Progress Notes (Signed)
Based on sensitivity results, changed bactrim DS to macrobid 100mg  bid. She should stop to bactrim and begin macrobid twice daily when she picks it up. Sent to pharmacy.

## 2018-07-27 ENCOUNTER — Telehealth: Payer: Self-pay

## 2018-07-27 NOTE — Telephone Encounter (Signed)
-----   Message from Ronnell Freshwater, NP sent at 07/26/2018  5:16 PM EST ----- Please let the patient know Based on sensitivity results, changed bactrim DS to macrobid 100mg  bid. She should stop to bactrim and begin macrobid twice daily when she picks it up. Sent to pharmacy. thanks

## 2018-07-27 NOTE — Telephone Encounter (Signed)
Informed pt of change in antibiotics per heather.

## 2018-08-24 ENCOUNTER — Other Ambulatory Visit: Payer: Self-pay | Admitting: Nurse Practitioner

## 2018-08-25 LAB — COMPREHENSIVE METABOLIC PANEL
ALK PHOS: 40 IU/L (ref 39–117)
ALT: 21 IU/L (ref 0–32)
AST: 18 IU/L (ref 0–40)
Albumin/Globulin Ratio: 1.5 (ref 1.2–2.2)
Albumin: 4.4 g/dL (ref 3.5–5.5)
BUN/Creatinine Ratio: 17 (ref 9–23)
BUN: 11 mg/dL (ref 6–24)
Bilirubin Total: 0.4 mg/dL (ref 0.0–1.2)
CO2: 21 mmol/L (ref 20–29)
Calcium: 9.4 mg/dL (ref 8.7–10.2)
Chloride: 107 mmol/L — ABNORMAL HIGH (ref 96–106)
Creatinine, Ser: 0.63 mg/dL (ref 0.57–1.00)
GFR calc Af Amer: 127 mL/min/{1.73_m2} (ref >59)
GFR calc non Af Amer: 110 mL/min/{1.73_m2} (ref >59)
GLUCOSE: 96 mg/dL (ref 65–99)
Globulin, Total: 3 g/dL (ref 1.5–4.5)
Potassium: 4.8 mmol/L (ref 3.5–5.2)
Sodium: 143 mmol/L (ref 134–144)
Total Protein: 7.4 g/dL (ref 6.0–8.5)

## 2018-08-25 LAB — CBC
Hematocrit: 40 % (ref 34.0–46.6)
Hemoglobin: 13.4 g/dL (ref 11.1–15.9)
MCH: 30.7 pg (ref 26.6–33.0)
MCHC: 33.5 g/dL (ref 31.5–35.7)
MCV: 92 fL (ref 79–97)
Platelets: 307 10*3/uL (ref 150–450)
RBC: 4.37 x10E6/uL (ref 3.77–5.28)
RDW: 12.5 % (ref 11.7–15.4)
WBC: 5.4 10*3/uL (ref 3.4–10.8)

## 2018-08-25 LAB — T3: T3, Total: 147 ng/dL (ref 71–180)

## 2018-08-25 LAB — LIPID PANEL W/O CHOL/HDL RATIO
Cholesterol, Total: 135 mg/dL (ref 100–199)
HDL: 37 mg/dL — ABNORMAL LOW (ref >39)
LDL Calculated: 61 mg/dL (ref 0–99)
Triglycerides: 186 mg/dL — ABNORMAL HIGH (ref 0–149)
VLDL Cholesterol Cal: 37 mg/dL (ref 5–40)

## 2018-08-25 LAB — VITAMIN D 25 HYDROXY (VIT D DEFICIENCY, FRACTURES): Vit D, 25-Hydroxy: 35.5 ng/mL (ref 30.0–100.0)

## 2018-08-25 LAB — T4, FREE: Free T4: 1.48 ng/dL (ref 0.82–1.77)

## 2018-08-25 LAB — TSH: TSH: 1.88 u[IU]/mL (ref 0.450–4.500)

## 2018-08-29 ENCOUNTER — Telehealth: Payer: Self-pay

## 2018-08-29 NOTE — Telephone Encounter (Signed)
Called pt and informed her of her lab results

## 2018-09-19 ENCOUNTER — Ambulatory Visit
Admission: RE | Admit: 2018-09-19 | Discharge: 2018-09-19 | Disposition: A | Discharge status: Home / Self Care | Payer: No Typology Code available for payment source | Source: Ambulatory Visit | Attending: Urology | Admitting: Urology

## 2018-09-19 DIAGNOSIS — N390 Urinary tract infection, site not specified: Secondary | ICD-10-CM | POA: Diagnosis not present

## 2018-09-25 ENCOUNTER — Telehealth: Payer: Self-pay | Admitting: Family Medicine

## 2018-09-25 NOTE — Telephone Encounter (Signed)
LMOM informed patient RUS was normal.

## 2018-09-25 NOTE — Telephone Encounter (Signed)
-----   Message from Abbie Sons, MD sent at 09/23/2018  1:30 PM EST ----- Renal ultrasound was normal

## 2018-10-15 ENCOUNTER — Encounter: Payer: Self-pay | Admitting: Adult Health

## 2018-10-15 ENCOUNTER — Other Ambulatory Visit: Payer: Self-pay

## 2018-10-15 ENCOUNTER — Ambulatory Visit (INDEPENDENT_AMBULATORY_CARE_PROVIDER_SITE_OTHER): Payer: No Typology Code available for payment source | Admitting: Adult Health

## 2018-10-15 VITALS — BP 123/89 | HR 115 | Temp 98.5°F | Resp 16 | Ht 63.0 in | Wt 136.0 lb

## 2018-10-15 DIAGNOSIS — R197 Diarrhea, unspecified: Secondary | ICD-10-CM | POA: Diagnosis not present

## 2018-10-15 DIAGNOSIS — R6889 Other general symptoms and signs: Secondary | ICD-10-CM | POA: Diagnosis not present

## 2018-10-15 LAB — POCT INFLUENZA A/B
Influenza A, POC: NEGATIVE
Influenza B, POC: NEGATIVE

## 2018-10-15 NOTE — Progress Notes (Signed)
[facility] [place], [place] [address]  Internal MEDICINE  Office Visit Note  Patient Name: Deanna Dillon  [id number]  [id number]  Date of Service: 10/15/2018  Chief Complaint  Patient presents with  . Fatigue  . Diarrhea  . Headache     HPI Pt is here for a sick visit. Pt is here reporting fatigue, diarrhea, and headache.  She was recently traveling in [place], [place], [place], and [place].  Her husband was sick on the day they returned, however he was bette within 24 hours.  She has now been sick with these symptoms for 3 days.  She reports her diarrhea is slowing now with OTC immodium.  She denies any respiratory symptoms at this time.  No fever, cough or SOB currently.       Current Medication:  Outpatient Encounter Medications as of 10/15/2018  Medication Sig  . amitriptyline (ELAVIL) 10 MG tablet Take 1 tablet (10 mg total) by mouth at bedtime.  . cyclobenzaprine (FLEXERIL) 10 MG tablet Take 1 tablet (10 mg total) by mouth daily.  Marland Kitchen ibuprofen (ADVIL,MOTRIN) 600 MG tablet Take 1 tablet (600 mg total) by mouth every 8 (eight) hours as needed.  . linaclotide (LINZESS) 145 MCG CAPS capsule Take 1 capsule (145 mcg total) by mouth daily before breakfast.  . nitrofurantoin, macrocrystal-monohydrate, (MACROBID) 100 MG capsule Take 1 capsule (100 mg total) by mouth 2 (two) times daily.  . tamoxifen (NOLVADEX) 20 MG tablet Take 20 mg by mouth daily.   No facility-administered encounter medications on file as of 10/15/2018.       Medical History: Past Medical History:  Diagnosis Date  . Arthritis   . Breast cancer ([facility])      Vital Signs: BP 123/89   Pulse (!) 115   Temp 98.5 F (36.9 C) (Oral)   Resp 16   Ht 5\' 3"  (1.6 m)   Wt 136 lb (61.7 kg)   SpO2 95%   BMI 24.09 kg/m    Review of Systems  Constitutional: Negative for chills, fatigue and unexpected weight change.  HENT: Negative for congestion, rhinorrhea, sneezing and sore throat.   Eyes:  Negative for photophobia, pain and redness.  Respiratory: Negative for cough, chest tightness and shortness of breath.   Cardiovascular: Negative for chest pain and palpitations.  Gastrointestinal: Positive for diarrhea. Negative for abdominal pain, constipation, nausea and vomiting.  Endocrine: Negative.   Genitourinary: Negative for dysuria and frequency.  Musculoskeletal: Negative for arthralgias, back pain, joint swelling and neck pain.  Skin: Negative for rash.  Allergic/Immunologic: Negative.   Neurological: Negative for tremors and numbness.  Hematological: Negative for adenopathy. Does not bruise/bleed easily.  Psychiatric/Behavioral: Negative for behavioral problems and sleep disturbance. The patient is not nervous/anxious.     Physical Exam Vitals signs and nursing note reviewed.  Constitutional:      General: She is not in acute distress.    Appearance: She is well-developed. She is not diaphoretic.  HENT:     Head: Normocephalic and atraumatic.     Mouth/Throat:     Pharynx: No oropharyngeal exudate.  Eyes:     Pupils: Pupils are equal, round, and reactive to light.  Neck:     Musculoskeletal: Normal range of motion and neck supple.     Thyroid: No thyromegaly.     Vascular: No JVD.     Trachea: No tracheal deviation.  Cardiovascular:     Rate and Rhythm: Normal rate and regular rhythm.  Heart sounds: Normal heart sounds. No murmur. No friction rub. No gallop.   Pulmonary:     Effort: Pulmonary effort is normal. No respiratory distress.     Breath sounds: Normal breath sounds. No wheezing or rales.  Chest:     Chest wall: No tenderness.  Abdominal:     Palpations: Abdomen is soft.     Tenderness: There is no abdominal tenderness. There is no guarding.  Musculoskeletal: Normal range of motion.  Lymphadenopathy:     Cervical: No cervical adenopathy.  Skin:    General: Skin is warm and dry.  Neurological:     Mental Status: She is alert and oriented to  person, place, and time.     Cranial Nerves: No cranial nerve deficit.  Psychiatric:        Behavior: Behavior normal.        Thought Content: Thought content normal.        Judgment: Judgment normal.    Assessment/Plan: 1. Diarrhea, unspecified type Diarrhea is resolving.  Encouraged patient to continue supportive measures.  Drink plenty of fluids to stay hydrated, and RTC if fever returns or new symptoms develop.  Eat a bland diet, and slowly advance.   2. Flu-like symptoms Negative today for Flu A and B - POCT Influenza A/B  General Counseling: Charese verbalizes understanding of the findings of todays visit and agrees with plan of treatment. I have discussed any further diagnostic evaluation that may be needed or ordered today. We also reviewed her medications today. she has been encouraged to call the office with any questions or concerns that should arise related to todays visit.   Orders Placed This Encounter  Procedures  . POCT Influenza A/B    No orders of the defined types were placed in this encounter.   Time spent: 35 Minutes  This patient was seen by Orson Gear AGNP-C in Collaboration with Dr Lavera Guise as a part of collaborative care agreement.  Kendell Bane AGNP-C Internal Medicine

## 2018-10-15 NOTE — Patient Instructions (Signed)
Viral Gastroenteritis, Adult    Viral gastroenteritis is also known as the stomach flu. This condition is caused by certain germs (viruses). These germs can be passed from person to person very easily (are very contagious). This condition can cause sudden watery poop (diarrhea), fever, and throwing up (vomiting).  Having watery poop and throwing up can make you feel weak and cause you to get dehydrated. Dehydration can make you tired and thirsty, make you have a dry mouth, and make it so you pee (urinate) less often. Older adults and people with other diseases or a weak defense system (immune system) are at higher risk for dehydration. It is important to replace the fluids that you lose from having watery poop and throwing up.  Follow these instructions at home:  Follow instructions from your doctor about how to care for yourself at home.  Eating and drinking  Follow these instructions as told by your doctor:   Take an oral rehydration solution (ORS). This is a drink that is sold at pharmacies and stores.   Drink clear fluids in small amounts as you are able, such as:  ? Water.  ? Ice chips.  ? Diluted fruit juice.  ? Low-calorie sports drinks.   Eat bland, easy-to-digest foods in small amounts as you are able, such as:  ? Bananas.  ? Applesauce.  ? Rice.  ? Low-fat (lean) meats.  ? Toast.  ? Crackers.   Avoid fluids that have a lot of sugar or caffeine in them.   Avoid alcohol.   Avoid spicy or fatty foods.  General instructions     Drink enough fluid to keep your pee (urine) clear or pale yellow.   Wash your hands often. If you cannot use soap and water, use hand sanitizer.   Make sure that all people in your home wash their hands well and often.   Rest at home while you get better.   Take over-the-counter and prescription medicines only as told by your doctor.   Watch your condition for any changes.   Take a warm bath to help with any burning or pain from having watery poop.   Keep all follow-up  visits as told by your doctor. This is important.  Contact a doctor if:   You cannot keep fluids down.   Your symptoms get worse.   You have new symptoms.   You feel light-headed or dizzy.   You have muscle cramps.  Get help right away if:   You have chest pain.   You feel very weak or you pass out (faint).   You see blood in your throw-up.   Your throw-up looks like coffee grounds.   You have bloody or black poop (stools) or poop that look like tar.   You have a very bad headache, a stiff neck, or both.   You have a rash.   You have very bad pain, cramping, or bloating in your belly (abdomen).   You have trouble breathing.   You are breathing very quickly.   Your heart is beating very quickly.   Your skin feels cold and clammy.   You feel confused.   You have pain when you pee.   You have signs of dehydration, such as:  ? Dark pee, hardly any pee, or no pee.  ? Cracked lips.  ? Dry mouth.  ? Sunken eyes.  ? Sleepiness.  ? Weakness.  This information is not intended to replace advice given to you by your   health care provider. Make sure you discuss any questions you have with your health care provider.  Document Released: 01/18/2008 Document Revised: 04/25/2018 Document Reviewed: 04/07/2015  Elsevier Interactive Patient Education  2019 [employer]

## 2018-10-19 ENCOUNTER — Telehealth: Payer: Self-pay | Admitting: Adult Health

## 2018-10-20 NOTE — Telephone Encounter (Signed)
Yes, I will write one for each of them.  Just ask what day they want to officially return.

## 2018-12-05 ENCOUNTER — Encounter: Payer: Self-pay | Admitting: Adult Health

## 2019-01-18 ENCOUNTER — Ambulatory Visit: Payer: Self-pay | Admitting: Adult Health

## 2019-01-22 ENCOUNTER — Ambulatory Visit: Payer: Self-pay | Admitting: Adult Health

## 2019-01-30 ENCOUNTER — Other Ambulatory Visit: Payer: Self-pay

## 2019-01-30 DIAGNOSIS — S161XXA Strain of muscle, fascia and tendon at neck level, initial encounter: Secondary | ICD-10-CM

## 2019-01-30 MED ORDER — CYCLOBENZAPRINE HCL 10 MG PO TABS: 10.0000 mg | ORAL_TABLET | Freq: Every day | ORAL | 0 refills | Status: DC

## 2019-02-06 ENCOUNTER — Encounter: Payer: Self-pay | Admitting: Adult Health

## 2019-02-06 ENCOUNTER — Other Ambulatory Visit: Payer: Self-pay

## 2019-02-06 ENCOUNTER — Ambulatory Visit: Payer: No Typology Code available for payment source | Admitting: Adult Health

## 2019-02-06 VITALS — Ht 63.0 in | Wt 140.0 lb

## 2019-02-06 DIAGNOSIS — G43809 Other migraine, not intractable, without status migrainosus: Secondary | ICD-10-CM

## 2019-02-06 DIAGNOSIS — G4700 Insomnia, unspecified: Secondary | ICD-10-CM | POA: Diagnosis not present

## 2019-02-06 DIAGNOSIS — S161XXA Strain of muscle, fascia and tendon at neck level, initial encounter: Secondary | ICD-10-CM

## 2019-02-06 MED ORDER — CYCLOBENZAPRINE HCL 10 MG PO TABS: 10.0000 mg | ORAL_TABLET | Freq: Two times a day (BID) | ORAL | 1 refills | Status: DC | PRN

## 2019-02-06 NOTE — Progress Notes (Signed)
[facility] [place], [place] [address]  Internal MEDICINE  Telephone Visit  Patient Name: Deanna Dillon  [id number]  [id number]  Date of Service: 02/06/2019  I connected with the patient at 1146 by telephone and verified the patients identity using two identifiers.  I discussed the limitations, risks, security and privacy concerns of performing an evaluation and management service by telephone and the availability of in person appointments. I also discussed with the patient that there may be a patient responsible charge related to the service.  The patient expressed understanding and agrees to proceed.    Chief Complaint  Patient presents with  . Telephone Screen  . Telephone Assessment  . Medical Management of Chronic Issues    6 month follow up , pt states that she is taking more muscle relaxers , up to two a day     HPI  PT is seen for follow up on migraines, chronic muscle strains and Insomnia.  Overall she is doing well. She reports her migraines are controlled currently and she has been sleeping okay.  She reports she has needed more flexeril recently for her chronic neck pain.  She has needed two tabs a day at times.  She is requesting refill on this currently.    Current Medication: Outpatient Encounter Medications as of 02/06/2019  Medication Sig  . amitriptyline (ELAVIL) 10 MG tablet Take 1 tablet (10 mg total) by mouth at bedtime.  . cyclobenzaprine (FLEXERIL) 10 MG tablet Take 1 tablet (10 mg total) by mouth 2 (two) times daily as needed for muscle spasms.  Marland Kitchen ibuprofen (ADVIL,MOTRIN) 600 MG tablet Take 1 tablet (600 mg total) by mouth every 8 (eight) hours as needed.  . linaclotide (LINZESS) 145 MCG CAPS capsule Take 1 capsule (145 mcg total) by mouth daily before breakfast.  . tamoxifen (NOLVADEX) 20 MG tablet Take 20 mg by mouth daily.  . [DISCONTINUED] cyclobenzaprine (FLEXERIL) 10 MG tablet Take 1 tablet (10 mg total) by mouth daily.  .  [DISCONTINUED] nitrofurantoin, macrocrystal-monohydrate, (MACROBID) 100 MG capsule Take 1 capsule (100 mg total) by mouth 2 (two) times daily. (Patient not taking: Reported on 02/06/2019)   No facility-administered encounter medications on file as of 02/06/2019.     Surgical History: Past Surgical History:  Procedure Laterality Date  . BILATERAL TOTAL MASTECTOMY WITH AXILLARY LYMPH NODE DISSECTION      Medical History: Past Medical History:  Diagnosis Date  . Arthritis   . Breast cancer ([place])     Family History: Family History  Problem Relation Age of Onset  . Diabetes Mother   . Hypertension Mother   . Hyperlipidemia Mother     Social History   Socioeconomic History  . Marital status: Married    Spouse name: Not on file  . Number of children: Not on file  . Years of education: Not on file  . Highest education level: Not on file  Occupational History  . Not on file  Social Needs  . Financial resource strain: Not on file  . Food insecurity    Worry: Not on file    Inability: Not on file  . Transportation needs    Medical: Not on file    Non-medical: Not on file  Tobacco Use  . Smoking status: Never Smoker  . Smokeless tobacco: Never Used  Substance and Sexual Activity  . Alcohol use: Never    Frequency: Never  . Drug use: Never  . Sexual activity: Yes  Lifestyle  .  Physical activity    Days per week: Not on file    Minutes per session: Not on file  . Stress: Not on file  Relationships  . Social Herbalist on phone: Not on file    Gets together: Not on file    Attends religious service: Not on file    Active member of club or organization: Not on file    Attends meetings of clubs or organizations: Not on file    Relationship status: Not on file  . Intimate partner violence    Fear of current or ex partner: Not on file    Emotionally abused: Not on file    Physically abused: Not on file    Forced sexual activity: Not on file  Other Topics  Concern  . Not on file  Social History Narrative  . Not on file      Review of Systems  Constitutional: Negative for chills, fatigue and unexpected weight change.  HENT: Negative for congestion, rhinorrhea, sneezing and sore throat.   Eyes: Negative for photophobia, pain and redness.  Respiratory: Negative for cough, chest tightness and shortness of breath.   Cardiovascular: Negative for chest pain and palpitations.  Gastrointestinal: Negative for abdominal pain, constipation, diarrhea, nausea and vomiting.  Endocrine: Negative.   Genitourinary: Negative for dysuria and frequency.  Musculoskeletal: Positive for neck pain and neck stiffness. Negative for arthralgias, back pain and joint swelling.  Skin: Negative for rash.  Allergic/Immunologic: Negative.   Neurological: Negative for tremors and numbness.  Hematological: Negative for adenopathy. Does not bruise/bleed easily.  Psychiatric/Behavioral: Negative for behavioral problems and sleep disturbance. The patient is not nervous/anxious.     Vital Signs: Ht 5\' 3"  (1.6 m)   Wt 140 lb (63.5 kg)   BMI 24.80 kg/m    Observation/Objective:  Well appearing, NAD noted.  Speaking in full sentences.    Assessment/Plan: 1. Strain of neck muscle, initial encounter Refilled patients flexeril.  Encouraged continued exercise and movement as tolerated.  - cyclobenzaprine (FLEXERIL) 10 MG tablet; Take 1 tablet (10 mg total) by mouth 2 (two) times daily as needed for muscle spasms.  Dispense: 180 tablet; Refill: 1  2. Other migraine without status migrainosus, not intractable Pts migraines are currently well controlled on current regimen. Continue at this time.   3. Insomnia, unspecified type stable currently, denies need. Continue current therapy.  General Counseling: Conya verbalizes understanding of the findings of today's phone visit and agrees with plan of treatment. I have discussed any further diagnostic evaluation that may be  needed or ordered today. We also reviewed her medications today. she has been encouraged to call the office with any questions or concerns that should arise related to todays visit.    No orders of the defined types were placed in this encounter.   Meds ordered this encounter  Medications  . cyclobenzaprine (FLEXERIL) 10 MG tablet    Sig: Take 1 tablet (10 mg total) by mouth 2 (two) times daily as needed for muscle spasms.    Dispense:  180 tablet    Refill:  1    Time spent: 12Minutes    Orson Gear AGNP-C Internal medicine

## 2019-05-15 ENCOUNTER — Other Ambulatory Visit: Payer: Self-pay

## 2019-05-15 DIAGNOSIS — S161XXA Strain of muscle, fascia and tendon at neck level, initial encounter: Secondary | ICD-10-CM

## 2019-05-15 MED ORDER — IBUPROFEN 600 MG PO TABS: 600.0000 mg | ORAL_TABLET | Freq: Three times a day (TID) | ORAL | 0 refills | Status: DC | PRN

## 2019-06-06 ENCOUNTER — Other Ambulatory Visit: Payer: Self-pay

## 2019-06-06 ENCOUNTER — Ambulatory Visit: Payer: No Typology Code available for payment source | Admitting: Internal Medicine

## 2019-06-06 ENCOUNTER — Encounter: Payer: Self-pay | Admitting: Adult Health

## 2019-06-06 VITALS — BP 120/84 | HR 76 | Temp 98.2°F | Resp 16 | Ht 63.0 in | Wt 149.0 lb

## 2019-06-06 DIAGNOSIS — Z170 Estrogen receptor positive status [ER+]: Secondary | ICD-10-CM

## 2019-06-06 DIAGNOSIS — G43809 Other migraine, not intractable, without status migrainosus: Secondary | ICD-10-CM

## 2019-06-06 DIAGNOSIS — M542 Cervicalgia: Secondary | ICD-10-CM | POA: Diagnosis not present

## 2019-06-06 DIAGNOSIS — G4700 Insomnia, unspecified: Secondary | ICD-10-CM

## 2019-06-06 DIAGNOSIS — C50811 Malignant neoplasm of overlapping sites of right female breast: Secondary | ICD-10-CM | POA: Diagnosis not present

## 2019-06-06 DIAGNOSIS — F411 Generalized anxiety disorder: Secondary | ICD-10-CM | POA: Diagnosis not present

## 2019-06-06 MED ORDER — TAMOXIFEN CITRATE 20 MG PO TABS: ORAL_TABLET | ORAL | 1 refills | Status: DC

## 2019-06-06 MED ORDER — AMITRIPTYLINE HCL 10 MG PO TABS: 10.0000 mg | ORAL_TABLET | Freq: Every day | ORAL | 1 refills | Status: DC

## 2019-06-06 MED ORDER — CITALOPRAM HYDROBROMIDE 20 MG PO TABS: ORAL_TABLET | ORAL | 1 refills | Status: DC

## 2019-06-06 MED ORDER — IBUPROFEN 600 MG PO TABS: 600.0000 mg | ORAL_TABLET | Freq: Three times a day (TID) | ORAL | 0 refills | Status: DC | PRN

## 2019-06-06 MED ORDER — CYCLOBENZAPRINE HCL 10 MG PO TABS: 10.0000 mg | ORAL_TABLET | Freq: Two times a day (BID) | ORAL | 1 refills | Status: DC | PRN

## 2019-06-06 NOTE — Progress Notes (Signed)
[facility] [place], [place] [address]  Internal MEDICINE  Office Visit Note  Patient Name: Deanna Dillon  [id number]  [id number]  Date of Service: 06/13/2019  Chief Complaint  Patient presents with  . Arthritis  . Migraine    HPI     Current Medication: Outpatient Encounter Medications as of 06/06/2019  Medication Sig  . amitriptyline (ELAVIL) 10 MG tablet Take 1 tablet (10 mg total) by mouth at bedtime.  . citalopram (CELEXA) 20 MG tablet Take one tab po qd for mood  . cyclobenzaprine (FLEXERIL) 10 MG tablet Take 1 tablet (10 mg total) by mouth 2 (two) times daily as needed for muscle spasms.  Marland Kitchen ibuprofen (ADVIL) 600 MG tablet Take 1 tablet (600 mg total) by mouth every 8 (eight) hours as needed.  . tamoxifen (NOLVADEX) 20 MG tablet Take one tab po qd  . [DISCONTINUED] amitriptyline (ELAVIL) 10 MG tablet Take 1 tablet (10 mg total) by mouth at bedtime.  . [DISCONTINUED] citalopram (CELEXA) 10 MG tablet Take 10 mg by mouth daily.  . [DISCONTINUED] cyclobenzaprine (FLEXERIL) 10 MG tablet Take 1 tablet (10 mg total) by mouth 2 (two) times daily as needed for muscle spasms.  . [DISCONTINUED] ibuprofen (ADVIL) 600 MG tablet Take 1 tablet (600 mg total) by mouth every 8 (eight) hours as needed.  . [DISCONTINUED] linaclotide (LINZESS) 145 MCG CAPS capsule Take 1 capsule (145 mcg total) by mouth daily before breakfast.  . [DISCONTINUED] tamoxifen (NOLVADEX) 20 MG tablet Take 20 mg by mouth daily.   No facility-administered encounter medications on file as of 06/06/2019.     Surgical History: Past Surgical History:  Procedure Laterality Date  . BILATERAL TOTAL MASTECTOMY WITH AXILLARY LYMPH NODE DISSECTION      Medical History: Past Medical History:  Diagnosis Date  . Arthritis   . Breast cancer ([place])   . Migraines     Family History: Family History  Problem Relation Age of Onset  . Diabetes Mother   . Hypertension Mother   . Hyperlipidemia  Mother     Social History   Socioeconomic History  . Marital status: Married    Spouse name: Not on file  . Number of children: Not on file  . Years of education: Not on file  . Highest education level: Not on file  Occupational History  . Not on file  Social Needs  . Financial resource strain: Not on file  . Food insecurity    Worry: Not on file    Inability: Not on file  . Transportation needs    Medical: Not on file    Non-medical: Not on file  Tobacco Use  . Smoking status: Never Smoker  . Smokeless tobacco: Never Used  Substance and Sexual Activity  . Alcohol use: Never    Frequency: Never  . Drug use: Never  . Sexual activity: Yes  Lifestyle  . Physical activity    Days per week: Not on file    Minutes per session: Not on file  . Stress: Not on file  Relationships  . Social Herbalist on phone: Not on file    Gets together: Not on file    Attends religious service: Not on file    Active member of club or organization: Not on file    Attends meetings of clubs or organizations: Not on file    Relationship status: Not on file  . Intimate partner violence    Fear of current  or ex partner: Not on file    Emotionally abused: Not on file    Physically abused: Not on file    Forced sexual activity: Not on file  Other Topics Concern  . Not on file  Social History Narrative  . Not on file      Review of Systems  Constitutional: Negative for chills, diaphoresis and fatigue.  HENT: Negative for ear pain, postnasal drip and sinus pressure.   Eyes: Negative for photophobia, discharge, redness, itching and visual disturbance.  Respiratory: Negative for cough, shortness of breath and wheezing.   Cardiovascular: Negative for chest pain, palpitations and leg swelling.  Gastrointestinal: Negative for abdominal pain, constipation, diarrhea, nausea and vomiting.  Genitourinary: Negative for dysuria and flank pain.  Musculoskeletal: Negative for arthralgias,  back pain, gait problem and neck pain.  Skin: Negative for color change.  Allergic/Immunologic: Negative for environmental allergies and food allergies.  Neurological: Negative for dizziness and headaches.  Hematological: Does not bruise/bleed easily.  Psychiatric/Behavioral: Negative for agitation, behavioral problems (depression) and hallucinations.    Vital Signs: BP 120/84   Pulse 76   Temp 98.2 F (36.8 C)   Resp 16   Ht 5\' 3"  (1.6 m)   Wt 149 lb (67.6 kg)   SpO2 98%   BMI 26.39 kg/m    Physical Exam Constitutional:      General: She is not in acute distress.    Appearance: She is well-developed. She is not diaphoretic.  HENT:     Head: Normocephalic and atraumatic.     Mouth/Throat:     Pharynx: No oropharyngeal exudate.  Eyes:     Pupils: Pupils are equal, round, and reactive to light.  Neck:     Musculoskeletal: Normal range of motion and neck supple.     Thyroid: No thyromegaly.     Vascular: No JVD.     Trachea: No tracheal deviation.  Cardiovascular:     Rate and Rhythm: Normal rate and regular rhythm.     Heart sounds: Normal heart sounds. No murmur. No friction rub. No gallop.   Pulmonary:     Effort: Pulmonary effort is normal. No respiratory distress.     Breath sounds: No wheezing or rales.  Chest:     Chest wall: No tenderness.  Abdominal:     General: Bowel sounds are normal.     Palpations: Abdomen is soft.  Musculoskeletal:        General: Tenderness present.     Comments: Neck// C-spine   Lymphadenopathy:     Cervical: No cervical adenopathy.  Skin:    General: Skin is warm and dry.  Neurological:     Mental Status: She is alert and oriented to person, place, and time.     Cranial Nerves: No cranial nerve deficit.  Psychiatric:        Behavior: Behavior normal.        Thought Content: Thought content normal.        Judgment: Judgment normal.    Assessment/Plan: 1. Neck pain Pt continues to have pain, might need to see ortho in  future or need further work up with Xray and MRI - ibuprofen (ADVIL) 600 MG tablet; Take 1 tablet (600 mg total) by mouth every 8 (eight) hours as needed.  Dispense: 90 tablet; Refill: 0 - cyclobenzaprine (FLEXERIL) 10 MG tablet; Take 1 tablet (10 mg total) by mouth 2 (two) times daily as needed for muscle spasms.  Dispense: 180 tablet; Refill: 1  2.  Malignant neoplasm of overlapping sites of right breast in female, estrogen receptor positive ([place]) She follows up with oncology, s/p bilateral mastectomies  - tamoxifen (NOLVADEX) 20 MG tablet; Take one tab po qd  Dispense: 90 tablet; Refill: 1  3. Other migraine without status migrainosus, not intractable Stable for now  - cyclobenzaprine (FLEXERIL) 10 MG tablet; Take 1 tablet (10 mg total) by mouth 2 (two) times daily as needed for muscle spasms.  Dispense: 180 tablet; Refill: 1  4. Insomnia, unspecified type - amitriptyline (ELAVIL) 10 MG tablet; Take 1 tablet (10 mg total) by mouth at bedtime.  Dispense: 90 tablet; Refill: 1  5. Generalized anxiety disorder Controlled with meds  - citalopram (CELEXA) 20 MG tablet; Take one tab po qd for mood  Dispense: 90 tablet; Refill: 1  General Counseling: Simone verbalizes understanding of the findings of todays visit and agrees with plan of treatment. I have discussed any further diagnostic evaluation that may be needed or ordered today. We also reviewed her medications today. she has been encouraged to call the office with any questions or concerns that should arise related to todays visit.  Meds ordered this encounter  Medications  . tamoxifen (NOLVADEX) 20 MG tablet    Sig: Take one tab po qd    Dispense:  90 tablet    Refill:  1  . ibuprofen (ADVIL) 600 MG tablet    Sig: Take 1 tablet (600 mg total) by mouth every 8 (eight) hours as needed.    Dispense:  90 tablet    Refill:  0  . cyclobenzaprine (FLEXERIL) 10 MG tablet    Sig: Take 1 tablet (10 mg total) by mouth 2 (two) times daily as  needed for muscle spasms.    Dispense:  180 tablet    Refill:  1  . citalopram (CELEXA) 20 MG tablet    Sig: Take one tab po qd for mood    Dispense:  90 tablet    Refill:  1  . amitriptyline (ELAVIL) 10 MG tablet    Sig: Take 1 tablet (10 mg total) by mouth at bedtime.    Dispense:  90 tablet    Refill:  1    Time spent:25 Minutes  Dr Lavera Guise Internal medicine

## 2019-07-24 ENCOUNTER — Telehealth: Payer: Self-pay

## 2019-07-24 NOTE — Telephone Encounter (Signed)
CONFIRMED AND SCREENED FOR 07-26-19 OV.

## 2019-07-26 ENCOUNTER — Encounter: Payer: Self-pay | Admitting: Nurse Practitioner

## 2019-12-20 ENCOUNTER — Other Ambulatory Visit: Payer: Self-pay | Admitting: Internal Medicine

## 2019-12-20 ENCOUNTER — Telehealth: Payer: Self-pay

## 2019-12-20 DIAGNOSIS — F411 Generalized anxiety disorder: Secondary | ICD-10-CM

## 2019-12-20 NOTE — Telephone Encounter (Signed)
HI CAN YOU MAKE AN APPT FOR HER, THEN FORWARD BACK TO SEND THE RX IN.

## 2020-01-06 ENCOUNTER — Other Ambulatory Visit: Payer: Self-pay

## 2020-01-06 ENCOUNTER — Emergency Department (HOSPITAL_BASED_OUTPATIENT_CLINIC_OR_DEPARTMENT_OTHER)
Admission: EM | Admit: 2020-01-06 | Discharge: 2020-01-07 | Disposition: A | Discharge status: Home / Self Care | Payer: No Typology Code available for payment source | Attending: Emergency Medicine | Admitting: Emergency Medicine

## 2020-01-06 ENCOUNTER — Emergency Department (HOSPITAL_BASED_OUTPATIENT_CLINIC_OR_DEPARTMENT_OTHER): Payer: No Typology Code available for payment source

## 2020-01-06 ENCOUNTER — Encounter (HOSPITAL_BASED_OUTPATIENT_CLINIC_OR_DEPARTMENT_OTHER): Payer: Self-pay | Admitting: *Deleted

## 2020-01-06 DIAGNOSIS — Z853 Personal history of malignant neoplasm of breast: Secondary | ICD-10-CM | POA: Diagnosis not present

## 2020-01-06 DIAGNOSIS — N644 Mastodynia: Secondary | ICD-10-CM | POA: Diagnosis present

## 2020-01-06 DIAGNOSIS — N6452 Nipple discharge: Secondary | ICD-10-CM | POA: Diagnosis not present

## 2020-01-06 DIAGNOSIS — Z79899 Other long term (current) drug therapy: Secondary | ICD-10-CM | POA: Insufficient documentation

## 2020-01-06 MED ORDER — DOXYCYCLINE HYCLATE 100 MG PO CAPS
100.0000 mg | ORAL_CAPSULE | Freq: Two times a day (BID) | ORAL | 0 refills | Status: DC
Start: 2020-01-06 — End: 2020-02-04

## 2020-01-06 MED ORDER — DOXYCYCLINE HYCLATE 100 MG PO TABS
100.0000 mg | ORAL_TABLET | Freq: Once | ORAL | Status: AC
  Administered 2020-01-07: 100 mg via ORAL
  Filled 2020-01-06: qty 1

## 2020-01-06 NOTE — ED Notes (Signed)
Patient transported to X-ray 

## 2020-01-06 NOTE — ED Triage Notes (Signed)
Pt c/o left breast pain with nipple drainage x 1 week , states " she thinks she ruptured her implant from moving heavy objects"

## 2020-01-06 NOTE — ED Notes (Signed)
ED Provider at bedside. Assisted MD with breast exam.

## 2020-01-06 NOTE — Discharge Instructions (Signed)
Follow-up at [facility] as scheduled on Thursday.  If you develop fever, redness, increased swelling or drainage from the breast, please return to the ER for repeat evaluation.

## 2020-01-06 NOTE — ED Provider Notes (Signed)
[place] EMERGENCY DEPARTMENT Provider Note   CSN: [id number] Arrival date & time: 01/06/20  2154     History Chief Complaint  Patient presents with  . Breast Pain    Deanna Dillon is a 44 y.o. female.  Patient presents to the emergency department for evaluation of breast pain, swelling and drainage.  Patient with history of right-sided breast cancer, status post bilateral mastectomy with reconstruction and bilateral implants.  She has noticed some drainage from the nipple for approximately a week and tonight started to notice some increased swelling of the breast.  She had contacted oncology at [facility] and has a scheduled ultrasound this week, but with the increased swelling, presented tonight for evaluation.  She has not had any fever.        Past Medical History:  Diagnosis Date  . Arthritis   . Breast cancer ([place])   . Migraines     Patient Active Problem List   Diagnosis Date Noted  . Routine cervical smear 07/20/2018  . Irritable bowel syndrome with constipation 07/20/2018  . Cervical strain 07/20/2018  . Dysuria 07/20/2018  . Malignant neoplasm of overlapping sites of right breast in female, estrogen receptor positive ([place]) 03/24/2018  . S/P bilateral mastectomy 03/24/2018    Past Surgical History:  Procedure Laterality Date  . BILATERAL TOTAL MASTECTOMY WITH AXILLARY LYMPH NODE DISSECTION       OB History   No obstetric history on file.     Family History  Problem Relation Age of Onset  . Diabetes Mother   . Hypertension Mother   . Hyperlipidemia Mother     Social History   Tobacco Use  . Smoking status: Never Smoker  . Smokeless tobacco: Never Used  Substance Use Topics  . Alcohol use: Never  . Drug use: Never    Home Medications Prior to Admission medications   Medication Sig Start Date End Date Taking? Authorizing Provider  amitriptyline (ELAVIL) 10 MG tablet Take 1 tablet (10 mg total) by mouth at bedtime. 06/06/19   Lavera Guise, MD  citalopram (CELEXA) 20 MG tablet Take one tab po qd for mood 06/06/19   Lavera Guise, MD  cyclobenzaprine (FLEXERIL) 10 MG tablet Take 1 tablet (10 mg total) by mouth 2 (two) times daily as needed for muscle spasms. 06/06/19   Lavera Guise, MD  doxycycline (VIBRAMYCIN) 100 MG capsule Take 1 capsule (100 mg total) by mouth 2 (two) times daily. 01/06/20   Orpah Greek, MD  ibuprofen (ADVIL) 600 MG tablet Take 1 tablet (600 mg total) by mouth every 8 (eight) hours as needed. 06/06/19   Lavera Guise, MD  tamoxifen (NOLVADEX) 20 MG tablet Take one tab po qd 06/06/19   Lavera Guise, MD    Allergies    Oxycodone  Review of Systems   Review of Systems  Constitutional: Negative for fever.  Skin: Negative for color change and wound.  All other systems reviewed and are negative.   Physical Exam Updated Vital Signs BP (!) 137/94   Pulse 78   Temp 98.5 F (36.9 C)   Resp 18   Ht 5\' 3"  (1.6 m)   Wt 64.9 kg   LMP 01/02/2020   SpO2 100%   BMI 25.33 kg/m   Physical Exam Vitals and nursing note reviewed. Exam conducted with a chaperone present.  Constitutional:      General: She is not in acute distress.    Appearance: Normal appearance. She is  well-developed.  HENT:     Head: Normocephalic and atraumatic.     Right Ear: Hearing normal.     Left Ear: Hearing normal.     Nose: Nose normal.  Eyes:     Conjunctiva/sclera: Conjunctivae normal.     Pupils: Pupils are equal, round, and reactive to light.  Cardiovascular:     Rate and Rhythm: Regular rhythm.     Heart sounds: S1 normal and S2 normal. No murmur. No friction rub. No gallop.   Pulmonary:     Effort: Pulmonary effort is normal. No respiratory distress.     Breath sounds: Normal breath sounds.  Chest:     Chest wall: Tenderness present.     Breasts:        Left: Tenderness present. No bleeding, inverted nipple, mass, nipple discharge or skin change.  Abdominal:     General: Bowel sounds are normal.      Palpations: Abdomen is soft.     Tenderness: There is no abdominal tenderness. There is no guarding or rebound. Negative signs include Murphy's sign and McBurney's sign.     Hernia: No hernia is present.  Musculoskeletal:        General: Normal range of motion.     Cervical back: Normal range of motion and neck supple.  Skin:    General: Skin is warm and dry.     Findings: No rash.  Neurological:     Mental Status: She is alert and oriented to person, place, and time.     GCS: GCS eye subscore is 4. GCS verbal subscore is 5. GCS motor subscore is 6.     Cranial Nerves: No cranial nerve deficit.     Sensory: No sensory deficit.     Coordination: Coordination normal.  Psychiatric:        Speech: Speech normal.        Behavior: Behavior normal.        Thought Content: Thought content normal.     ED Results / Procedures / Treatments   Labs (all labs ordered are listed, but only abnormal results are displayed) Labs Reviewed  CULTURE, BLOOD (ROUTINE X 2)  CULTURE, BLOOD (ROUTINE X 2)  CBC WITH DIFFERENTIAL/PLATELET  BASIC METABOLIC PANEL  SEDIMENTATION RATE  C-REACTIVE PROTEIN    EKG None  Radiology DG Chest 2 View  Result Date: 01/06/2020 CLINICAL DATA:  Left breast pain with nipple discharge EXAM: CHEST - 2 VIEW COMPARISON:  None. FINDINGS: The heart size and mediastinal contours are within normal limits. Both lungs are clear. The visualized skeletal structures are unremarkable. Bilateral breast prostheses. IMPRESSION: No active cardiopulmonary disease. Electronically Signed   By: Donavan Foil M.D.   On: 01/06/2020 22:45    Procedures Procedures (including critical care time)  Medications Ordered in ED Medications  doxycycline (VIBRA-TABS) tablet 100 mg (has no administration in time range)    ED Course  I have reviewed the triage vital signs and the nursing notes.  Pertinent labs & imaging results that were available during my care of the patient were reviewed by  me and considered in my medical decision making (see chart for details).    MDM Rules/Calculators/A&P                      Examination does not reveal any external erythema, induration or lesions.  There are no fluctuant lesions noted.  No fluid expressible from the nipple at this time.  Patient does report that she  has been noticing a somewhat yellow drainage, however.  Vital signs are normal.  Discussed with patient the possibility of infection.  She does have follow-up already scheduled.  Will order baseline labs to help with her follow-up at [facility], will initiate antibiotics.  Patient given follow-up precautions, will monitor for worsening signs of infection.  Final Clinical Impression(s) / ED Diagnoses Final diagnoses:  Breast discharge    Rx / DC Orders ED Discharge Orders         Ordered    doxycycline (VIBRAMYCIN) 100 MG capsule  2 times daily     01/06/20 2340           Orpah Greek, MD 01/06/20 2340

## 2020-01-07 LAB — CBC WITH DIFFERENTIAL/PLATELET
Abs Immature Granulocytes: 0.02 10*3/uL (ref 0.00–0.07)
Basophils Absolute: 0 10*3/uL (ref 0.0–0.1)
Basophils Relative: 1 %
Eosinophils Absolute: 0.2 10*3/uL (ref 0.0–0.5)
Eosinophils Relative: 4 %
HCT: 40.4 % (ref 36.0–46.0)
Hemoglobin: 13.4 g/dL (ref 12.0–15.0)
Immature Granulocytes: 0 %
Lymphocytes Relative: 30 %
Lymphs Abs: 1.6 10*3/uL (ref 0.7–4.0)
MCH: 30.7 pg (ref 26.0–34.0)
MCHC: 33.2 g/dL (ref 30.0–36.0)
MCV: 92.7 fL (ref 80.0–100.0)
Monocytes Absolute: 0.5 10*3/uL (ref 0.1–1.0)
Monocytes Relative: 9 %
Neutro Abs: 2.9 10*3/uL (ref 1.7–7.7)
Neutrophils Relative %: 56 %
Platelets: 276 10*3/uL (ref 150–400)
RBC: 4.36 MIL/uL (ref 3.87–5.11)
RDW: 12.6 % (ref 11.5–15.5)
WBC: 5.3 10*3/uL (ref 4.0–10.5)
nRBC: 0 % (ref 0.0–0.2)

## 2020-01-07 LAB — BASIC METABOLIC PANEL
Anion gap: 10 (ref 5–15)
BUN: 9 mg/dL (ref 6–20)
CO2: 26 mmol/L (ref 22–32)
Calcium: 8.7 mg/dL — ABNORMAL LOW (ref 8.9–10.3)
Chloride: 104 mmol/L (ref 98–111)
Creatinine, Ser: 0.72 mg/dL (ref 0.44–1.00)
GFR calc Af Amer: >60 mL/min (ref >60)
GFR calc non Af Amer: >60 mL/min (ref >60)
Glucose, Bld: 105 mg/dL — ABNORMAL HIGH (ref 70–99)
Potassium: 3.7 mmol/L (ref 3.5–5.1)
Sodium: 140 mmol/L (ref 135–145)

## 2020-01-07 LAB — C-REACTIVE PROTEIN: CRP: 0.5 mg/dL (ref <1.0)

## 2020-01-07 LAB — SEDIMENTATION RATE: Sed Rate: 8 mm/hr (ref 0–22)

## 2020-01-12 LAB — CULTURE, BLOOD (ROUTINE X 2)
Culture: NO GROWTH
Culture: NO GROWTH
Special Requests: ADEQUATE
Special Requests: ADEQUATE

## 2020-01-31 ENCOUNTER — Telehealth: Payer: Self-pay

## 2020-01-31 NOTE — Telephone Encounter (Signed)
Called lmom informing patient of appointment on 02/04/2020. klh °

## 2020-02-01 ENCOUNTER — Other Ambulatory Visit: Payer: Self-pay | Admitting: Internal Medicine

## 2020-02-01 DIAGNOSIS — C50811 Malignant neoplasm of overlapping sites of right female breast: Secondary | ICD-10-CM

## 2020-02-03 NOTE — Telephone Encounter (Signed)
Pt needs to [facility] follow up app

## 2020-02-04 ENCOUNTER — Other Ambulatory Visit: Payer: Self-pay

## 2020-02-04 ENCOUNTER — Encounter: Payer: Self-pay | Admitting: Adult Health

## 2020-02-04 ENCOUNTER — Ambulatory Visit (INDEPENDENT_AMBULATORY_CARE_PROVIDER_SITE_OTHER): Payer: No Typology Code available for payment source | Admitting: Adult Health

## 2020-02-04 VITALS — BP 119/85 | HR 101 | Temp 97.5°F | Resp 16 | Ht 63.0 in | Wt 144.0 lb

## 2020-02-04 DIAGNOSIS — Z0001 Encounter for general adult medical examination with abnormal findings: Secondary | ICD-10-CM | POA: Diagnosis not present

## 2020-02-04 DIAGNOSIS — L708 Other acne: Secondary | ICD-10-CM

## 2020-02-04 DIAGNOSIS — G43809 Other migraine, not intractable, without status migrainosus: Secondary | ICD-10-CM | POA: Diagnosis not present

## 2020-02-04 DIAGNOSIS — Z170 Estrogen receptor positive status [ER+]: Secondary | ICD-10-CM

## 2020-02-04 DIAGNOSIS — F411 Generalized anxiety disorder: Secondary | ICD-10-CM

## 2020-02-04 DIAGNOSIS — C50811 Malignant neoplasm of overlapping sites of right female breast: Secondary | ICD-10-CM

## 2020-02-04 DIAGNOSIS — R300 Dysuria: Secondary | ICD-10-CM

## 2020-02-04 DIAGNOSIS — R5383 Other fatigue: Secondary | ICD-10-CM

## 2020-02-04 DIAGNOSIS — G4700 Insomnia, unspecified: Secondary | ICD-10-CM | POA: Diagnosis not present

## 2020-02-04 DIAGNOSIS — M542 Cervicalgia: Secondary | ICD-10-CM

## 2020-02-04 DIAGNOSIS — Z79899 Other long term (current) drug therapy: Secondary | ICD-10-CM

## 2020-02-04 MED ORDER — AMITRIPTYLINE HCL 25 MG PO TABS: 25.0000 mg | ORAL_TABLET | Freq: Every day | ORAL | 1 refills | Status: DC

## 2020-02-04 MED ORDER — CITALOPRAM HYDROBROMIDE 20 MG PO TABS: ORAL_TABLET | ORAL | 1 refills | Status: DC

## 2020-02-04 MED ORDER — TRETINOIN 0.025 % EX GEL: Freq: Every day | CUTANEOUS | 0 refills | Status: AC

## 2020-02-04 MED ORDER — CYCLOBENZAPRINE HCL 10 MG PO TABS: 10.0000 mg | ORAL_TABLET | Freq: Two times a day (BID) | ORAL | 1 refills | Status: DC | PRN

## 2020-02-04 NOTE — Progress Notes (Signed)
[facility], [place] [address]  Internal MEDICINE  Office Visit Note  Patient Name: Deanna Dillon  [id number]  [id number]  Date of Service: 02/04/2020  Chief Complaint  Patient presents with  . Annual Exam    change in meds possibly     HPI Pt is here for routine health maintenance examination.  She is a well appearing 44 yo female.  She has a history of Breast Cancer in 2018. She also has GAD which is currently controlled.  She is recently separated and moved to [place].   She has an appt to see a plastic surgeon,for fluid around her implant this week.  She had some Abnormal cells on recent biopsy.   Current Medication: Outpatient Encounter Medications as of 02/04/2020  Medication Sig  . ibuprofen (ADVIL) 600 MG tablet Take 1 tablet (600 mg total) by mouth every 8 (eight) hours as needed.  . tamoxifen (NOLVADEX) 20 MG tablet TAKE 1 TABLET BY MOUTH EVERY DAY  . [DISCONTINUED] amitriptyline (ELAVIL) 10 MG tablet Take 1 tablet (10 mg total) by mouth at bedtime.  . [DISCONTINUED] citalopram (CELEXA) 20 MG tablet Take one tab po qd for mood  . [DISCONTINUED] cyclobenzaprine (FLEXERIL) 10 MG tablet Take 1 tablet (10 mg total) by mouth 2 (two) times daily as needed for muscle spasms.  . [DISCONTINUED] doxycycline (VIBRAMYCIN) 100 MG capsule Take 1 capsule (100 mg total) by mouth 2 (two) times daily.  Marland Kitchen amitriptyline (ELAVIL) 25 MG tablet Take 1 tablet (25 mg total) by mouth at bedtime.  . citalopram (CELEXA) 20 MG tablet Take one tab po qd for mood  . cyclobenzaprine (FLEXERIL) 10 MG tablet Take 1 tablet (10 mg total) by mouth 2 (two) times daily as needed for muscle spasms.  Marland Kitchen tretinoin (RETIN-A) 0.025 % gel Apply topically at bedtime.   No facility-administered encounter medications on file as of 02/04/2020.    Surgical History: Past Surgical History:  Procedure Laterality Date  . BILATERAL TOTAL MASTECTOMY WITH AXILLARY LYMPH NODE DISSECTION       Medical History: Past Medical History:  Diagnosis Date  . Arthritis   . Breast cancer ([place])   . Migraines     Family History: Family History  Problem Relation Age of Onset  . Diabetes Mother   . Hypertension Mother   . Hyperlipidemia Mother       Review of Systems  Constitutional: Negative for chills, fatigue and unexpected weight change.  HENT: Negative for congestion, rhinorrhea, sneezing and sore throat.   Eyes: Negative for photophobia, pain and redness.  Respiratory: Negative for cough, chest tightness and shortness of breath.   Cardiovascular: Negative for chest pain and palpitations.  Gastrointestinal: Negative for abdominal pain, constipation, diarrhea, nausea and vomiting.  Endocrine: Negative.   Genitourinary: Negative for dysuria and frequency.  Musculoskeletal: Negative for arthralgias, back pain, joint swelling and neck pain.  Skin: Negative for rash.  Allergic/Immunologic: Negative.   Neurological: Negative for tremors and numbness.  Hematological: Negative for adenopathy. Does not bruise/bleed easily.  Psychiatric/Behavioral: Negative for behavioral problems and sleep disturbance. The patient is not nervous/anxious.      Vital Signs: BP 119/85   Pulse (!) 101   Temp (!) 97.5 F (36.4 C)   Resp 16   Ht 5\' 3"  (1.6 m)   Wt 144 lb (65.3 kg)   SpO2 100%   BMI 25.51 kg/m    Physical Exam Vitals and nursing note reviewed.  Constitutional:  General: She is not in acute distress.    Appearance: She is well-developed. She is not diaphoretic.  HENT:     Head: Normocephalic and atraumatic.     Mouth/Throat:     Pharynx: No oropharyngeal exudate.  Eyes:     Pupils: Pupils are equal, round, and reactive to light.  Neck:     Thyroid: No thyromegaly.     Vascular: No JVD.     Trachea: No tracheal deviation.  Cardiovascular:     Rate and Rhythm: Normal rate and regular rhythm.     Heart sounds: Normal heart sounds. No murmur heard.  No  friction rub. No gallop.   Pulmonary:     Effort: Pulmonary effort is normal. No respiratory distress.     Breath sounds: Normal breath sounds. No wheezing or rales.  Chest:     Chest wall: No tenderness.  Abdominal:     Palpations: Abdomen is soft.     Tenderness: There is no abdominal tenderness. There is no guarding.  Musculoskeletal:        General: Normal range of motion.     Cervical back: Normal range of motion and neck supple.  Lymphadenopathy:     Cervical: No cervical adenopathy.  Skin:    General: Skin is warm and dry.  Neurological:     Mental Status: She is alert and oriented to person, place, and time.     Cranial Nerves: No cranial nerve deficit.  Psychiatric:        Behavior: Behavior normal.        Thought Content: Thought content normal.        Judgment: Judgment normal.      LABS: Recent Results (from the past 2160 hour(s))  Culture, blood (Routine X 2) w Reflex to ID Panel     Status: None   Collection Time: 01/06/20 11:30 PM   Specimen: BLOOD RIGHT ARM  Result Value Ref Range   Specimen Description      BLOOD RIGHT ARM Performed at [facility], [address] [place], [place] [address]    Special Requests      BOTTLES DRAWN AEROBIC AND ANAEROBIC Blood Culture adequate volume Performed at [facility], [address] [place], [place] [address]    Culture      NO GROWTH 5 DAYS Performed at [facility], [place] [address], [place],  [address]    Report Status 01/12/2020 FINAL   CBC with Differential/Platelet     Status: None   Collection Time: 01/06/20 11:45 PM  Result Value Ref Range   WBC 5.3 4.0 - 10.5 K/uL   RBC 4.36 3.87 - 5.11 MIL/uL   Hemoglobin 13.4 12.0 - 15.0 g/dL   HCT 40.4 36 - 46 %   MCV 92.7 80.0 - 100.0 fL   MCH 30.7 26.0 - 34.0 pg   MCHC 33.2 30.0 - 36.0 g/dL   RDW 12.6 11.5 - 15.5 %   Platelets 276 150 - 400 K/uL   nRBC 0.0 0.0 - 0.2 %   Neutrophils Relative % 56 %   Neutro Abs 2.9 1.7 -  7.7 K/uL   Lymphocytes Relative 30 %   Lymphs Abs 1.6 0.7 - 4.0 K/uL   Monocytes Relative 9 %   Monocytes Absolute 0.5 0 - 1 K/uL   Eosinophils Relative 4 %   Eosinophils Absolute 0.2 0 - 0 K/uL   Basophils Relative 1 %   Basophils Absolute  0.0 0 - 0 K/uL   Immature Granulocytes 0 %   Abs Immature Granulocytes 0.02 0.00 - 0.07 K/uL    Comment: Performed at [facility], [address] [place], [place] [address]  Basic metabolic panel     Status: Abnormal   Collection Time: 01/06/20 11:45 PM  Result Value Ref Range   Sodium 140 135 - 145 mmol/L   Potassium 3.7 3.5 - 5.1 mmol/L   Chloride 104 98 - 111 mmol/L   CO2 26 22 - 32 mmol/L   Glucose, Bld 105 (H) 70 - 99 mg/dL    Comment: Glucose reference range applies only to samples taken after fasting for at least 8 hours.   BUN 9 6 - 20 mg/dL   Creatinine, Ser 0.72 0.44 - 1.00 mg/dL   Calcium 8.7 (L) 8.9 - 10.3 mg/dL   GFR calc non Af Amer >60 >60 mL/min   GFR calc Af Amer >60 >60 mL/min   Anion gap 10 5 - 15    Comment: Performed at [facility], [address] [place], [place] [address]  Sedimentation rate     Status: None   Collection Time: 01/06/20 11:45 PM  Result Value Ref Range   Sed Rate 8 0 - 22 mm/hr    Comment: Performed at [facility], [address] [place], [place] [address]  C-reactive protein     Status: None   Collection Time: 01/06/20 11:45 PM  Result Value Ref Range   CRP 0.5 <1.0 mg/dL    Comment: Performed at [facility], [place] [address], [place], [place] [address]  Culture, blood (Routine X 2) w Reflex to ID Panel     Status: None   Collection Time: 01/06/20 11:45 PM   Specimen: BLOOD LEFT HAND  Result Value Ref Range   Specimen Description      BLOOD LEFT HAND Performed at [facility], [address] [place], [place] [address]    Special Requests      BOTTLES DRAWN AEROBIC AND ANAEROBIC Blood Culture adequate volume Performed at [facility], [address] [place], [place] [address]    Culture      NO GROWTH 5 DAYS Performed at [facility], [place] [address], [place], [place] [address]    Report Status 01/12/2020 FINAL      Assessment/Plan: 1. Encounter for general adult medical examination with abnormal findings Up to date on PHM. She will have labs drawn and follow up as scheduled.  - Lipid Panel With LDL/HDL Ratio - TSH - T4, free  2. Insomnia, unspecified type Refilled Elavil as discussed - amitriptyline (ELAVIL) 25 MG tablet; Take 1 tablet (25 mg total) by mouth at bedtime.  Dispense: 90 tablet; Refill: 1  3. Other migraine without status migrainosus, not intractable Refilled flexeril and elavil as discussed.  - amitriptyline (ELAVIL) 25 MG tablet; Take 1 tablet (25 mg total) by mouth at bedtime.  Dispense: 90 tablet; Refill: 1 - cyclobenzaprine (FLEXERIL) 10 MG tablet; Take 1 tablet (10 mg total) by mouth 2 (two) times daily as needed for muscle spasms.  Dispense: 180 tablet; Refill: 1 - Ambulatory referral to Neurology  4. Generalized anxiety disorder Stable, continue Celexa as directed.  - citalopram (CELEXA) 20 MG tablet; Take one tab po qd for mood  Dispense: 90 tablet; Refill: 1  5. Neck pain - cyclobenzaprine (FLEXERIL) 10 MG tablet; Take  1 tablet (10 mg total) by mouth 2 (two) times daily as needed for muscle spasms.  Dispense: 180 tablet; Refill: 1  6. Other acne Sent Retin-A and discussed use with patient.  - tretinoin (RETIN-A) 0.025 % gel; Apply topically at bedtime.  Dispense: 45 g; Refill: 0  7. Dysuria - Urinalysis, Routine w reflex microscopic  8. Long-term use of high-risk medication - Lipid Panel With LDL/HDL Ratio - TSH - T4, free  9. Other fatigue - TSH - T4, free  10. Malignant neoplasm of overlapping sites of right breast in female, estrogen receptor positive [facility]) Patient will continue to see oncology as scheduled to eval fluid around implants.   General  Counseling: Eloyce verbalizes understanding of the findings of todays visit and agrees with plan of treatment. I have discussed any further diagnostic evaluation that may be needed or ordered today. We also reviewed her medications today. she has been encouraged to call the office with any questions or concerns that should arise related to todays visit.   Orders Placed This Encounter  Procedures  . Urinalysis, Routine w reflex microscopic    Meds ordered this encounter  Medications  . amitriptyline (ELAVIL) 25 MG tablet    Sig: Take 1 tablet (25 mg total) by mouth at bedtime.    Dispense:  90 tablet    Refill:  1  . citalopram (CELEXA) 20 MG tablet    Sig: Take one tab po qd for mood    Dispense:  90 tablet    Refill:  1  . cyclobenzaprine (FLEXERIL) 10 MG tablet    Sig: Take 1 tablet (10 mg total) by mouth 2 (two) times daily as needed for muscle spasms.    Dispense:  180 tablet    Refill:  1  . tretinoin (RETIN-A) 0.025 % gel    Sig: Apply topically at bedtime.    Dispense:  45 g    Refill:  0    Time spent: 30 Minutes   This patient was seen by Orson Gear AGNP-C in Collaboration with Dr Lavera Guise as a part of collaborative care agreement    Kendell Bane AGNP-C Internal Medicine

## 2020-02-05 ENCOUNTER — Telehealth: Payer: Self-pay

## 2020-02-05 LAB — URINALYSIS, ROUTINE W REFLEX MICROSCOPIC
Bilirubin, UA: NEGATIVE
Glucose, UA: NEGATIVE
Ketones, UA: NEGATIVE
Nitrite, UA: NEGATIVE
Protein,UA: NEGATIVE
RBC, UA: NEGATIVE
Specific Gravity, UA: 1.008 (ref 1.005–1.030)
Urobilinogen, Ur: 0.2 mg/dL (ref 0.2–1.0)
pH, UA: 7 (ref 5.0–7.5)

## 2020-02-05 LAB — MICROSCOPIC EXAMINATION
Casts: NONE SEEN /lpf
WBC, UA: NONE SEEN /hpf (ref 0–5)

## 2020-02-05 NOTE — Telephone Encounter (Signed)
Authorization for Tretinoin 0.025% Gel is approved from 02/04/2020 through 02/02/2021 [facility]

## 2020-02-10 ENCOUNTER — Telehealth: Payer: Self-pay

## 2020-02-10 NOTE — Telephone Encounter (Signed)
Pt advised we like see you to discuss medication change and she don't having symptoms for UTI

## 2020-02-11 ENCOUNTER — Telehealth: Payer: Self-pay

## 2020-02-11 NOTE — Telephone Encounter (Signed)
Called lmom informing patient of appointment on 02/13/2020. klh

## 2020-02-12 ENCOUNTER — Other Ambulatory Visit: Payer: Self-pay | Admitting: Internal Medicine

## 2020-02-12 DIAGNOSIS — M542 Cervicalgia: Secondary | ICD-10-CM

## 2020-02-13 ENCOUNTER — Encounter: Payer: Self-pay | Admitting: Adult Health

## 2020-02-13 ENCOUNTER — Other Ambulatory Visit: Payer: Self-pay

## 2020-02-13 ENCOUNTER — Ambulatory Visit: Payer: No Typology Code available for payment source | Admitting: Adult Health

## 2020-02-13 VITALS — BP 113/80 | HR 99 | Temp 97.8°F | Resp 16 | Ht 63.0 in | Wt 143.8 lb

## 2020-02-13 DIAGNOSIS — G43809 Other migraine, not intractable, without status migrainosus: Secondary | ICD-10-CM

## 2020-02-13 DIAGNOSIS — F411 Generalized anxiety disorder: Secondary | ICD-10-CM

## 2020-02-13 MED ORDER — PROPRANOLOL HCL 20 MG PO TABS: 20.0000 mg | ORAL_TABLET | Freq: Two times a day (BID) | ORAL | 0 refills | Status: DC

## 2020-02-13 NOTE — Progress Notes (Signed)
[facility] [place], [place] [address]  Internal MEDICINE  Office Visit Note  Patient Name: Deanna Dillon  [id number]  [id number]  Date of Service: 02/13/2020  Chief Complaint  Patient presents with   Follow-up    discuss headache meds    HPI Pt is here reporting that she would like to get off her elavil for migraines at this time due to weight gain.  She has not taken it in over a week.  She is feeling good, but continues to have migraines.  Her last one was a few days ago.  She would like to try propranolol.         Current Medication: Outpatient Encounter Medications as of 02/13/2020  Medication Sig   amitriptyline (ELAVIL) 25 MG tablet Take 1 tablet (25 mg total) by mouth at bedtime.   citalopram (CELEXA) 20 MG tablet Take one tab po qd for mood   cyclobenzaprine (FLEXERIL) 10 MG tablet Take 1 tablet (10 mg total) by mouth 2 (two) times daily as needed for muscle spasms.   ibuprofen (ADVIL) 600 MG tablet TAKE 1 TABLET BY MOUTH EVERY 8 HOURS AS NEEDED.   tamoxifen (NOLVADEX) 20 MG tablet TAKE 1 TABLET BY MOUTH EVERY DAY   tretinoin (RETIN-A) 0.025 % gel Apply topically at bedtime.   propranolol (INDERAL) 20 MG tablet Take 1 tablet (20 mg total) by mouth 2 (two) times daily.   No facility-administered encounter medications on file as of 02/13/2020.    Surgical History: Past Surgical History:  Procedure Laterality Date   BILATERAL TOTAL MASTECTOMY WITH AXILLARY LYMPH NODE DISSECTION      Medical History: Past Medical History:  Diagnosis Date   Arthritis    Breast cancer ([place])    Migraines     Family History: Family History  Problem Relation Age of Onset   Diabetes Mother    Hypertension Mother    Hyperlipidemia Mother     Social History   Socioeconomic History   Marital status: Married    Spouse name: Not on file   Number of children: Not on file   Years of education: Not on file   Highest education level: Not on  file  Occupational History   Not on file  Tobacco Use   Smoking status: Never Smoker   Smokeless tobacco: Never Used  Vaping Use   Vaping Use: Never used  Substance and Sexual Activity   Alcohol use: Never   Drug use: Never   Sexual activity: Yes  Other Topics Concern   Not on file  Social History Narrative   Not on file   Social Determinants of Health   Financial Resource Strain:    Difficulty of Paying Living Expenses:   Food Insecurity:    Worried About Charity fundraiser in the Last Year:    Arboriculturist in the Last Year:   Transportation Needs:    Film/video editor (Medical):    Lack of Transportation (Non-Medical):   Physical Activity:    Days of Exercise per Week:    Minutes of Exercise per Session:   Stress:    Feeling of Stress :   Social Connections:    Frequency of Communication with Friends and Family:    Frequency of Social Gatherings with Friends and Family:    Attends Religious Services:    Active Member of Clubs or Organizations:    Attends Archivist Meetings:    Marital Status:   Intimate  Partner Violence:    Fear of Current or Ex-Partner:    Emotionally Abused:    Physically Abused:    Sexually Abused:       Review of Systems  Constitutional: Negative for chills, fatigue and unexpected weight change.  HENT: Negative for congestion, rhinorrhea, sneezing and sore throat.   Eyes: Negative for photophobia, pain and redness.  Respiratory: Negative for cough, chest tightness and shortness of breath.   Cardiovascular: Negative for chest pain and palpitations.  Gastrointestinal: Negative for abdominal pain, constipation, diarrhea, nausea and vomiting.  Endocrine: Negative.   Genitourinary: Negative for dysuria and frequency.  Musculoskeletal: Negative for arthralgias, back pain, joint swelling and neck pain.  Skin: Negative for rash.  Allergic/Immunologic: Negative.   Neurological: Negative for  tremors and numbness.  Hematological: Negative for adenopathy. Does not bruise/bleed easily.  Psychiatric/Behavioral: Negative for behavioral problems and sleep disturbance. The patient is not nervous/anxious.     Vital Signs: BP 113/80    Pulse 99    Temp 97.8 F (36.6 C)    Resp 16    Ht 5\' 3"  (1.6 m)    Wt 143 lb 12.8 oz (65.2 kg)    SpO2 97%    BMI 25.47 kg/m    Physical Exam Vitals and nursing note reviewed.  Constitutional:      General: She is not in acute distress.    Appearance: She is well-developed. She is not diaphoretic.  HENT:     Head: Normocephalic and atraumatic.     Mouth/Throat:     Pharynx: No oropharyngeal exudate.  Eyes:     Pupils: Pupils are equal, round, and reactive to light.  Neck:     Thyroid: No thyromegaly.     Vascular: No JVD.     Trachea: No tracheal deviation.  Cardiovascular:     Rate and Rhythm: Normal rate and regular rhythm.     Heart sounds: Normal heart sounds. No murmur heard.  No friction rub. No gallop.   Pulmonary:     Effort: Pulmonary effort is normal. No respiratory distress.     Breath sounds: Normal breath sounds. No wheezing or rales.  Chest:     Chest wall: No tenderness.  Abdominal:     Palpations: Abdomen is soft.     Tenderness: There is no abdominal tenderness. There is no guarding.  Musculoskeletal:        General: Normal range of motion.     Cervical back: Normal range of motion and neck supple.  Lymphadenopathy:     Cervical: No cervical adenopathy.  Skin:    General: Skin is warm and dry.  Neurological:     Mental Status: She is alert and oriented to person, place, and time.     Cranial Nerves: No cranial nerve deficit.  Psychiatric:        Behavior: Behavior normal.        Thought Content: Thought content normal.        Judgment: Judgment normal.    Assessment/Plan: 1. Other migraine without status migrainosus, not intractable Stop elavil, and start trial of inderall for migraines.  - propranolol  (INDERAL) 20 MG tablet; Take 1 tablet (20 mg total) by mouth 2 (two) times daily.  Dispense: 60 tablet; Refill: 0  2. Generalized anxiety disorder Well controlled continue current mgmt.   General Counseling: Lasheika verbalizes understanding of the findings of todays visit and agrees with plan of treatment. I have discussed any further diagnostic evaluation that may be needed  or ordered today. We also reviewed her medications today. she has been encouraged to call the office with any questions or concerns that should arise related to todays visit.    No orders of the defined types were placed in this encounter.   Meds ordered this encounter  Medications   propranolol (INDERAL) 20 MG tablet    Sig: Take 1 tablet (20 mg total) by mouth 2 (two) times daily.    Dispense:  60 tablet    Refill:  0    Time spent: 25 Minutes   This patient was seen by Orson Gear AGNP-C in Collaboration with Dr Lavera Guise as a part of collaborative care agreement     Kendell Bane AGNP-C Internal medicine

## 2020-02-27 ENCOUNTER — Telehealth: Payer: Self-pay

## 2020-02-27 NOTE — Telephone Encounter (Signed)
Confirmed appointment on 03/02/2020 and screened for covid. klh  

## 2020-03-02 ENCOUNTER — Ambulatory Visit: Payer: No Typology Code available for payment source | Admitting: Adult Health

## 2020-03-06 ENCOUNTER — Other Ambulatory Visit: Payer: Self-pay | Admitting: Adult Health

## 2020-03-06 ENCOUNTER — Telehealth: Payer: Self-pay

## 2020-03-06 DIAGNOSIS — G43809 Other migraine, not intractable, without status migrainosus: Secondary | ICD-10-CM

## 2020-03-06 NOTE — Telephone Encounter (Signed)
BILLED MISSED APPOINTMENT FEE 03/02/20

## 2020-03-07 ENCOUNTER — Other Ambulatory Visit: Payer: Self-pay | Admitting: Adult Health

## 2020-03-07 DIAGNOSIS — M542 Cervicalgia: Secondary | ICD-10-CM

## 2020-04-01 ENCOUNTER — Other Ambulatory Visit: Payer: Self-pay | Admitting: Adult Health

## 2020-04-01 DIAGNOSIS — G43809 Other migraine, not intractable, without status migrainosus: Secondary | ICD-10-CM

## 2020-04-02 ENCOUNTER — Other Ambulatory Visit: Payer: Self-pay | Admitting: Adult Health

## 2020-04-02 DIAGNOSIS — M542 Cervicalgia: Secondary | ICD-10-CM

## 2020-04-03 ENCOUNTER — Other Ambulatory Visit: Payer: Self-pay | Admitting: Internal Medicine

## 2020-04-03 DIAGNOSIS — C50811 Malignant neoplasm of overlapping sites of right female breast: Secondary | ICD-10-CM

## 2020-04-03 DIAGNOSIS — Z170 Estrogen receptor positive status [ER+]: Secondary | ICD-10-CM

## 2020-04-25 ENCOUNTER — Other Ambulatory Visit: Payer: Self-pay | Admitting: Adult Health

## 2020-04-25 DIAGNOSIS — G43809 Other migraine, not intractable, without status migrainosus: Secondary | ICD-10-CM

## 2020-04-26 NOTE — Telephone Encounter (Signed)
Please discuss with dfk about her refill

## 2020-05-03 ENCOUNTER — Other Ambulatory Visit: Payer: Self-pay | Admitting: Adult Health

## 2020-05-03 DIAGNOSIS — M542 Cervicalgia: Secondary | ICD-10-CM

## 2020-05-05 ENCOUNTER — Other Ambulatory Visit: Payer: Self-pay | Admitting: Adult Health

## 2020-05-05 DIAGNOSIS — Z170 Estrogen receptor positive status [ER+]: Secondary | ICD-10-CM

## 2020-05-11 ENCOUNTER — Other Ambulatory Visit: Payer: Self-pay | Admitting: Adult Health

## 2020-05-11 DIAGNOSIS — M542 Cervicalgia: Secondary | ICD-10-CM

## 2020-05-11 DIAGNOSIS — C50811 Malignant neoplasm of overlapping sites of right female breast: Secondary | ICD-10-CM

## 2020-05-11 DIAGNOSIS — G43809 Other migraine, not intractable, without status migrainosus: Secondary | ICD-10-CM

## 2020-05-11 MED ORDER — RIZATRIPTAN BENZOATE 10 MG PO TABS: 10.0000 mg | ORAL_TABLET | ORAL | 0 refills | Status: AC | PRN

## 2020-05-11 MED ORDER — TAMOXIFEN CITRATE 20 MG PO TABS: 20.0000 mg | ORAL_TABLET | Freq: Every day | ORAL | 0 refills | Status: AC

## 2020-05-11 MED ORDER — CYCLOBENZAPRINE HCL 10 MG PO TABS: 10.0000 mg | ORAL_TABLET | Freq: Two times a day (BID) | ORAL | 1 refills | Status: AC | PRN

## 2020-05-11 MED ORDER — VENLAFAXINE HCL 37.5 MG PO TABS: 75.0000 mg | ORAL_TABLET | Freq: Every day | ORAL | 0 refills | Status: AC

## 2020-05-11 NOTE — Progress Notes (Signed)
Refilled patients meds as she is moving next month and will find a new provider in [place].

## 2020-07-28 ENCOUNTER — Other Ambulatory Visit: Payer: Self-pay | Admitting: Adult Health

## 2020-07-28 DIAGNOSIS — M542 Cervicalgia: Secondary | ICD-10-CM

## 2020-08-25 ENCOUNTER — Other Ambulatory Visit: Payer: Self-pay | Admitting: Adult Health

## 2020-10-23 IMAGING — DX DG CHEST 2V
2 series · 2 of 2 positions shown · non-contrast
Comparison: None.

CLINICAL DATA: Left breast pain with nipple discharge

EXAM:
CHEST - 2 VIEW

[chest pa]
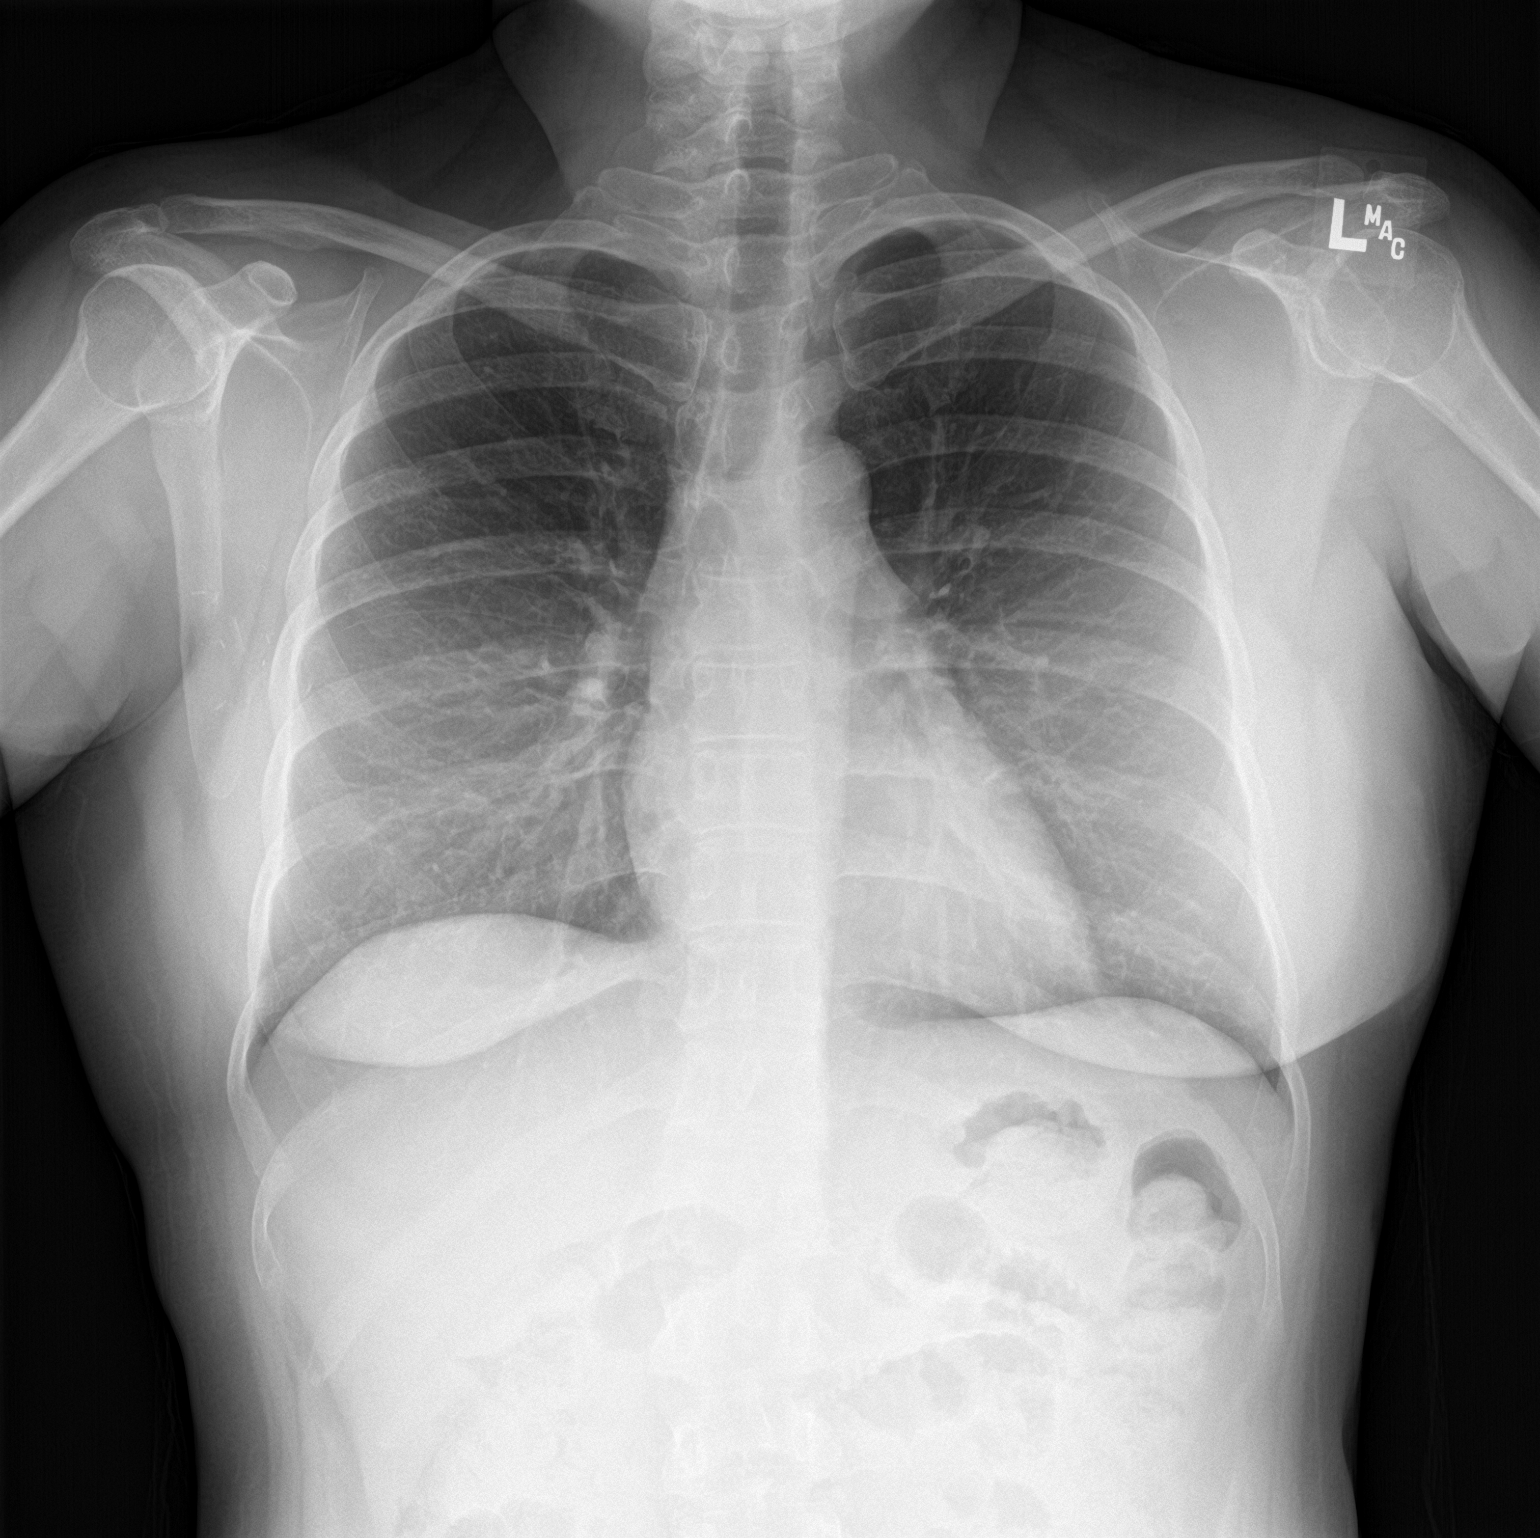

[chest lat]
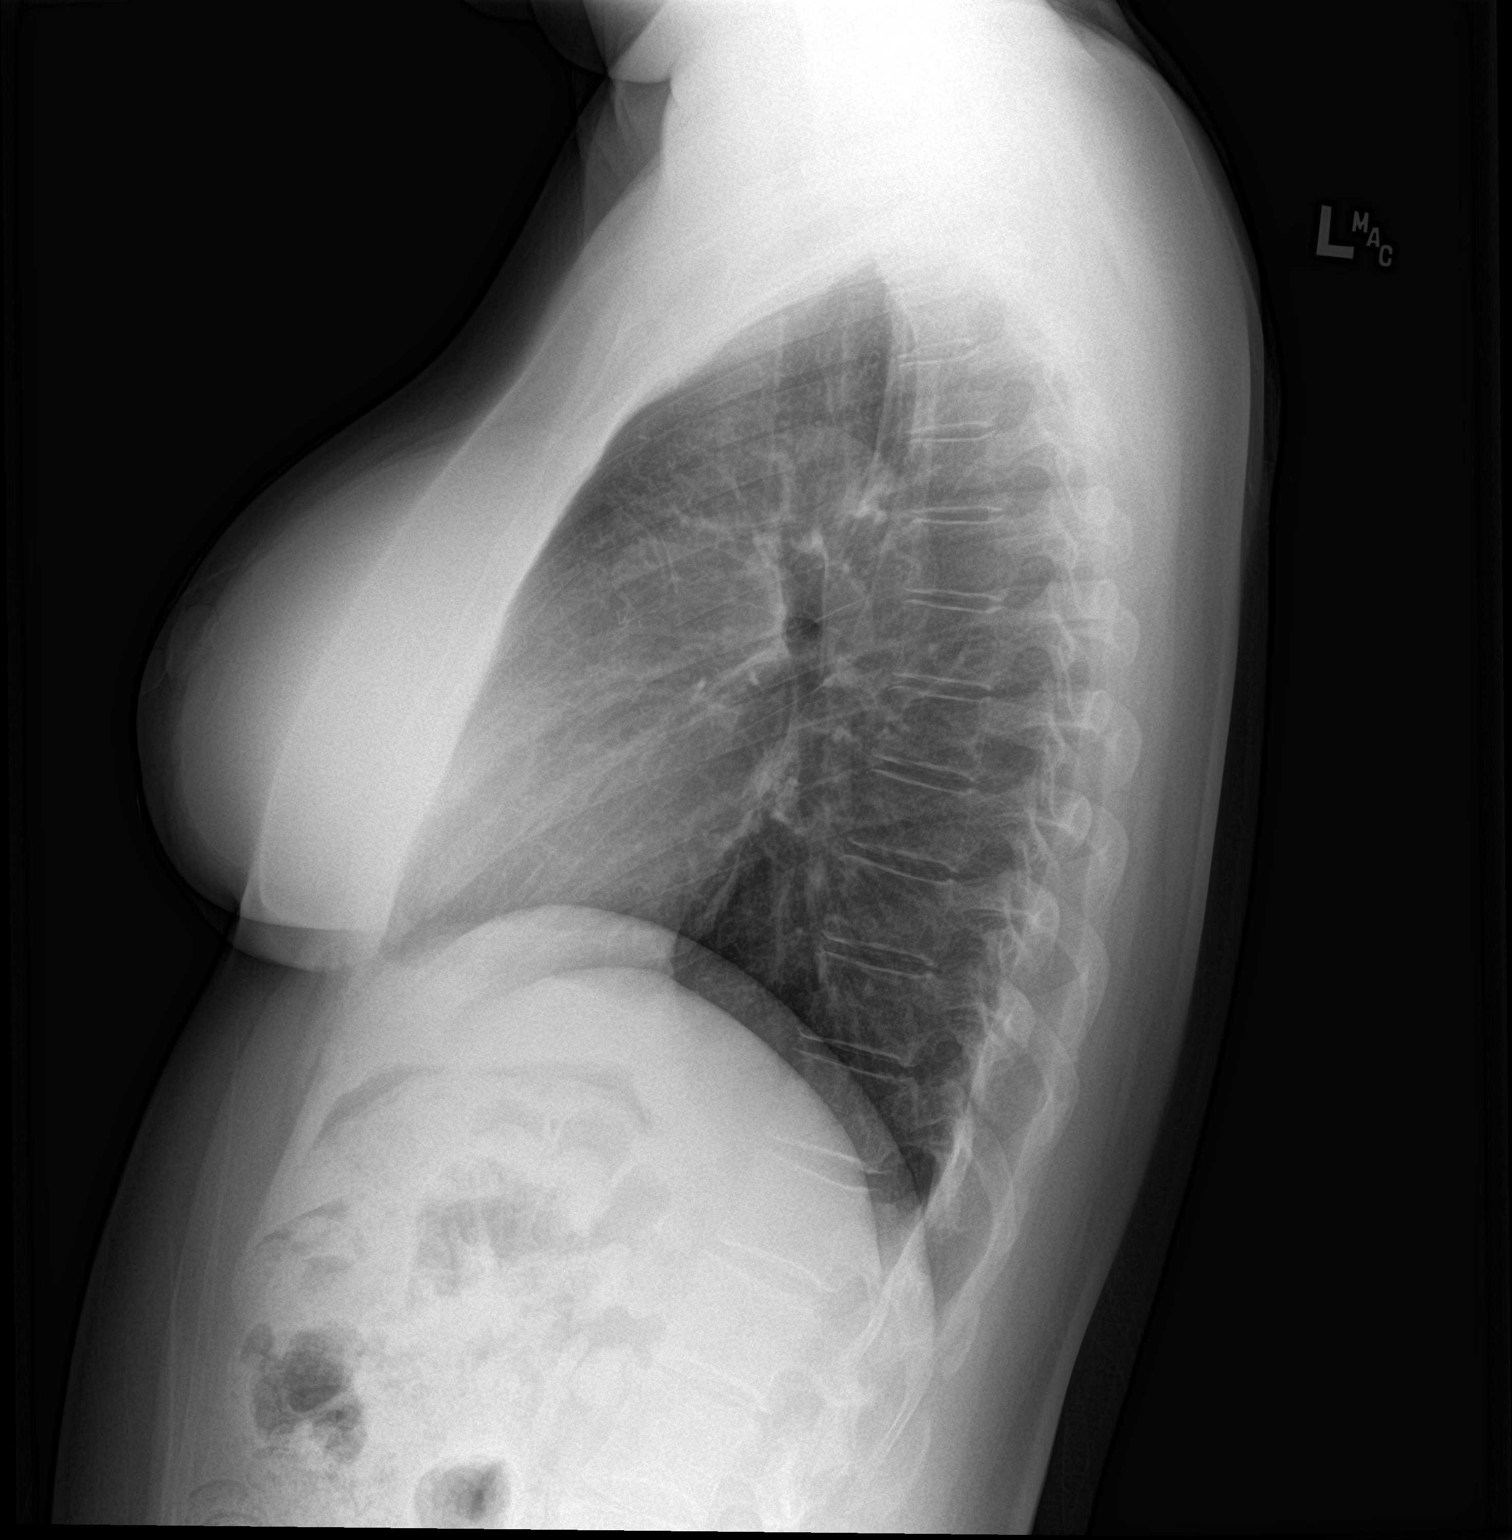

[2 of 2 positions shown; findings below may reference images not displayed]

FINDINGS: The heart size and mediastinal contours are within normal limits.
Both lungs are clear. The visualized skeletal structures are
unremarkable. Bilateral breast prostheses.
IMPRESSION: No active cardiopulmonary disease.

## 2021-01-15 IMAGING — US US RENAL
1 series · 14 of 25 positions shown · non-contrast
Comparison: None.

CLINICAL DATA: Initial evaluation for recurrent UTI.

EXAM:
RENAL / URINARY TRACT ULTRASOUND COMPLETE

[Series 1: us renal · 0.20mm/px · 14 of 33 slices shown]
[im 1/33]
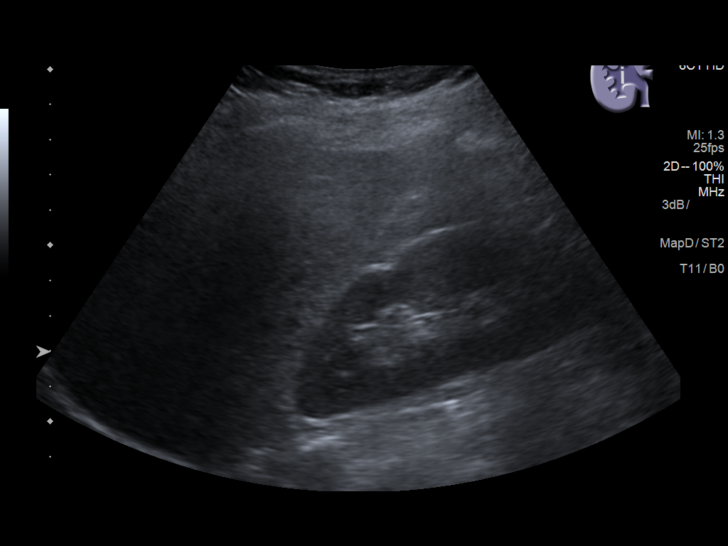
[im 3/33]
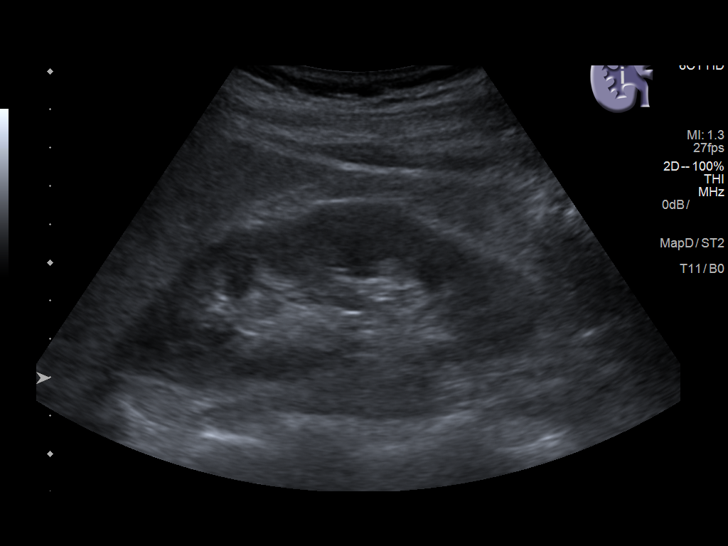
[im 6/33]
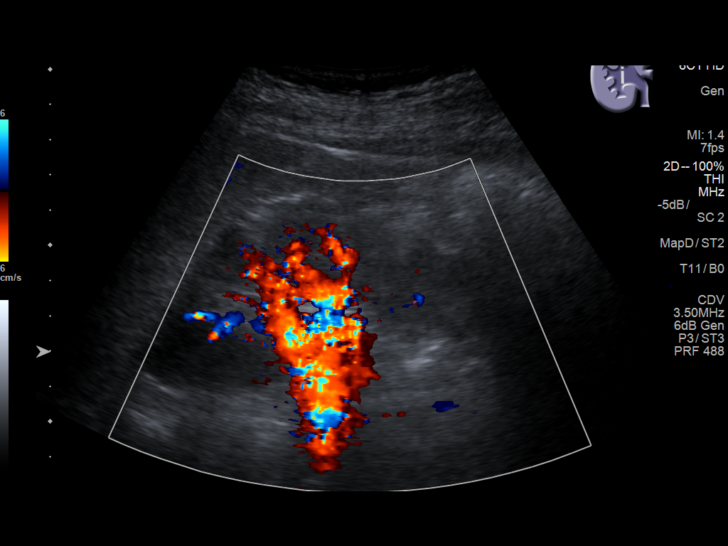
[im 9/33]
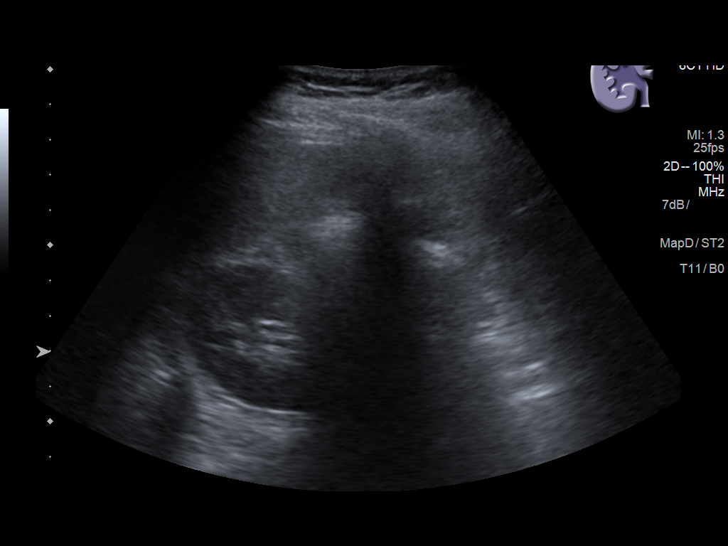
[im 11/33]
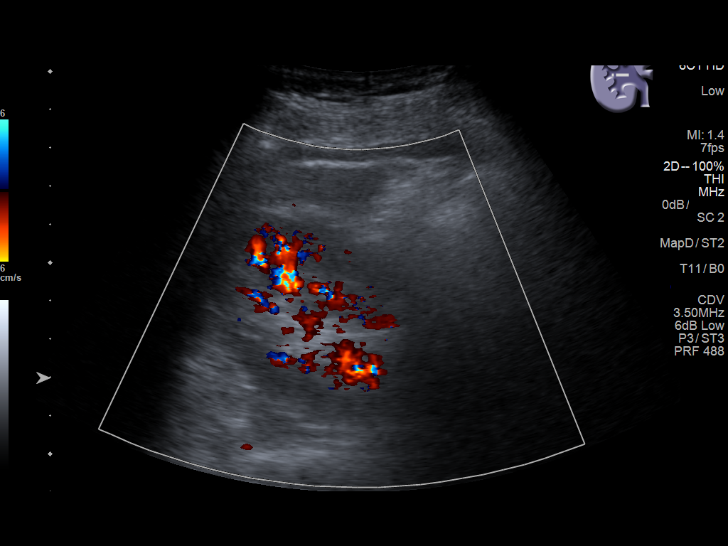
[im 13/33]
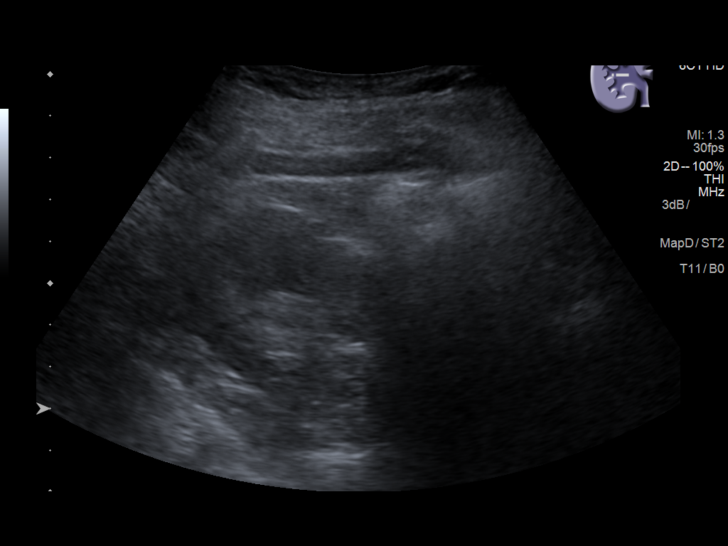
[im 15/33]
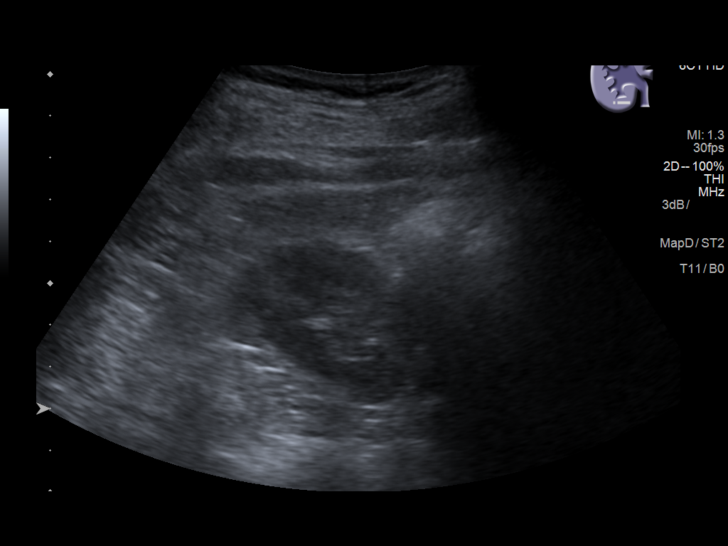
[im 18/33]
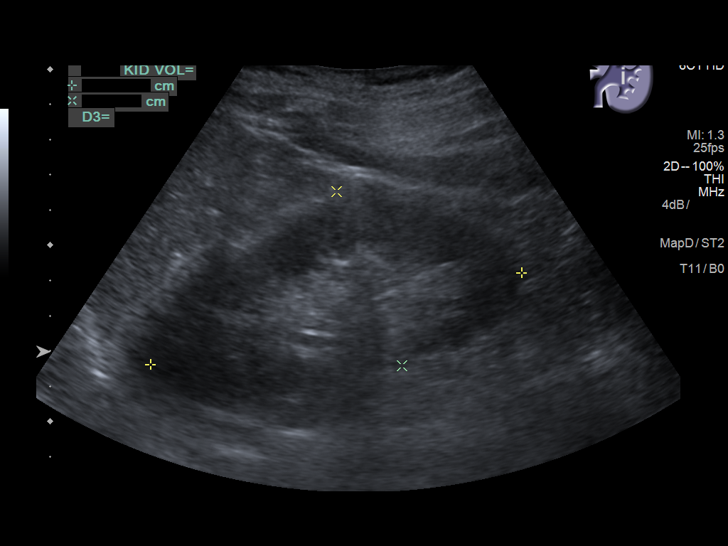
[im 21/33]
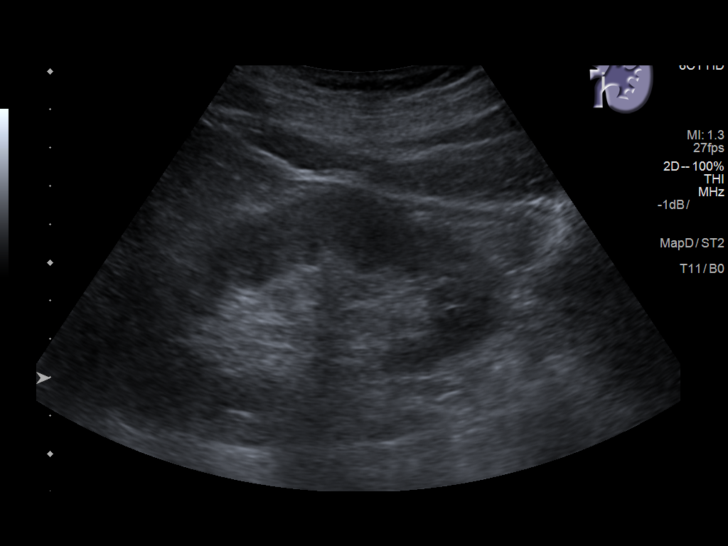
[im 22/33]
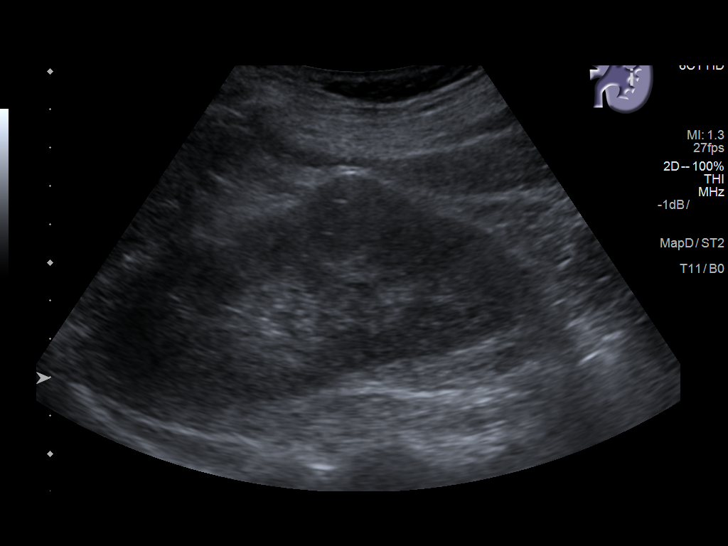
[im 25/33]
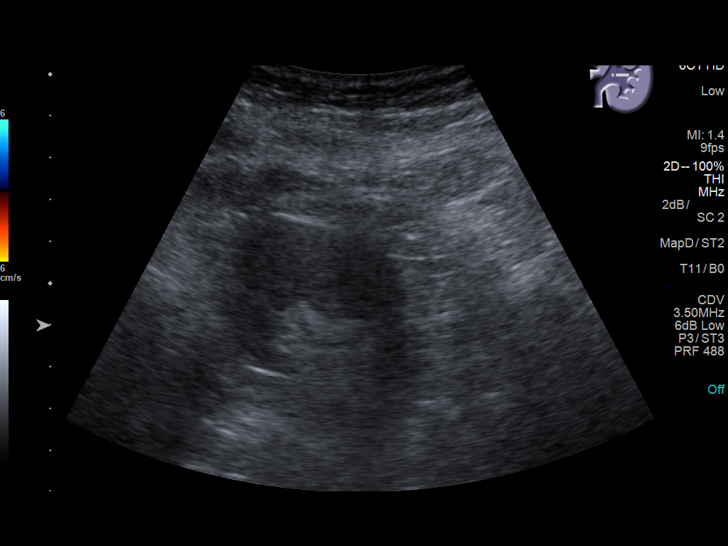
[im 27/33]
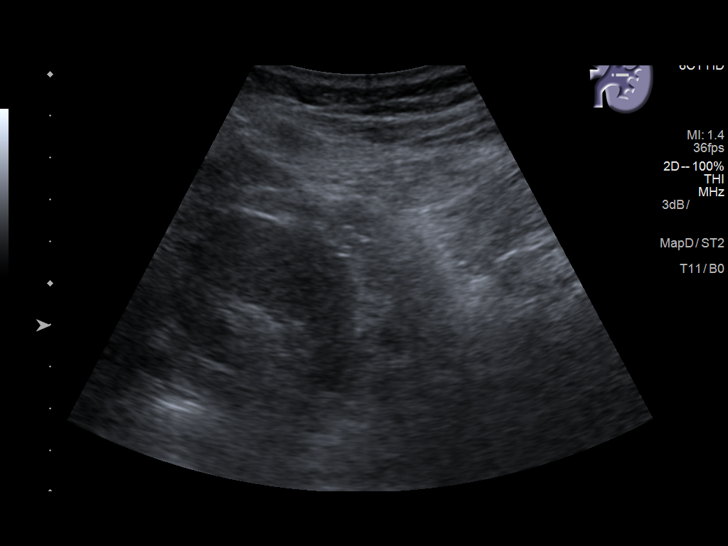
[im 30/33]
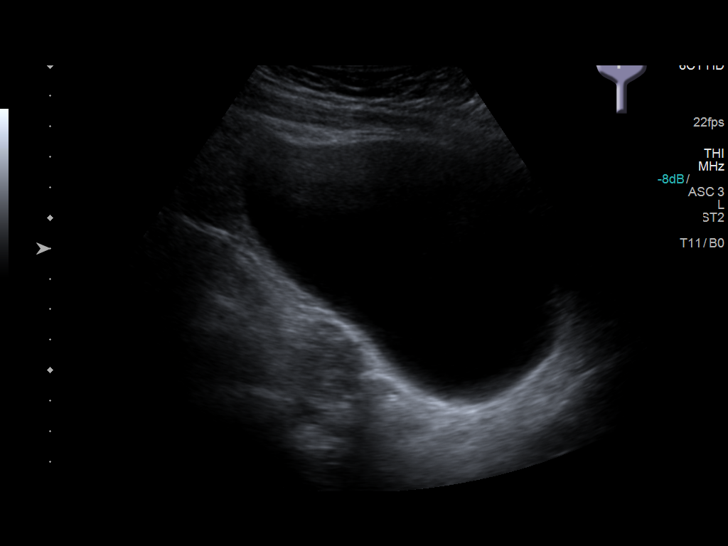
[im 33/33]
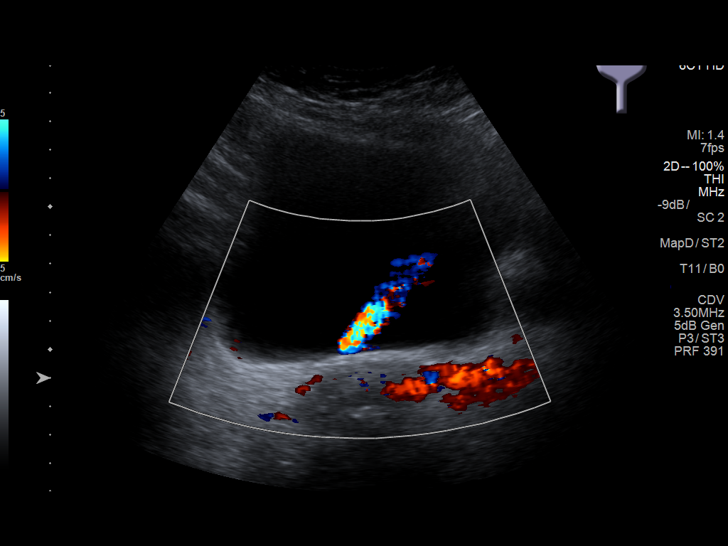

[14 of 25 positions shown; findings below may reference images not displayed]

FINDINGS: Right Kidney:

Renal measurements: 10.8 x 4.4 x 4.0 cm = volume: 99.5 mL .
Echogenicity within normal limits. No mass or hydronephrosis
visualized. No nephrolithiasis.

Left Kidney:

Renal measurements: 10.8 x 5.3 x 3.9 cm = volume: 118.1 mL.
Echogenicity within normal limits. No mass or hydronephrosis
visualized. No nephrolithiasis.

Bladder:

Appears normal for degree of bladder distention. Bilateral ureteral
jets were visualized.
IMPRESSION: Normal renal ultrasound.  No hydronephrosis.

## 2021-01-25 ENCOUNTER — Other Ambulatory Visit: Payer: Self-pay

## 2021-01-25 DIAGNOSIS — Z170 Estrogen receptor positive status [ER+]: Secondary | ICD-10-CM

## 2021-02-04 ENCOUNTER — Encounter: Payer: No Typology Code available for payment source | Admitting: Nurse Practitioner

## 2021-02-04 ENCOUNTER — Other Ambulatory Visit: Payer: Self-pay | Admitting: Internal Medicine

## 2021-02-05 ENCOUNTER — Telehealth: Payer: Self-pay

## 2021-02-05 NOTE — Telephone Encounter (Signed)
Patient was mailed a dismissal letter due to non compliancy. Copy of letter placed in scan. Deanna Dillon
